# Patient Record
Sex: Male | Born: 1945 | Race: White | Hispanic: No | Marital: Married | Smoking: Never smoker
Health system: Southern US, Community
[De-identification: ages and names within clinical notes are randomized; demographics above are authoritative.]

## PROBLEM LIST (undated history)

## (undated) DIAGNOSIS — L719 Rosacea, unspecified: Secondary | ICD-10-CM

## (undated) DIAGNOSIS — Z8719 Personal history of other diseases of the digestive system: Secondary | ICD-10-CM

## (undated) DIAGNOSIS — M1990 Unspecified osteoarthritis, unspecified site: Secondary | ICD-10-CM

## (undated) DIAGNOSIS — K5000 Crohn's disease of small intestine without complications: Secondary | ICD-10-CM

## (undated) DIAGNOSIS — M8580 Other specified disorders of bone density and structure, unspecified site: Secondary | ICD-10-CM

## (undated) DIAGNOSIS — Z8711 Personal history of peptic ulcer disease: Secondary | ICD-10-CM

## (undated) DIAGNOSIS — D649 Anemia, unspecified: Secondary | ICD-10-CM

## (undated) DIAGNOSIS — K222 Esophageal obstruction: Secondary | ICD-10-CM

## (undated) DIAGNOSIS — K219 Gastro-esophageal reflux disease without esophagitis: Secondary | ICD-10-CM

## (undated) HISTORY — PX: OTHER SURGICAL HISTORY: SHX169

## (undated) HISTORY — PX: APPENDECTOMY: SHX54

## (undated) HISTORY — PX: TONSILLECTOMY: SUR1361

## (undated) HISTORY — PX: COLON SURGERY: SHX602

---

## 1991-05-05 HISTORY — PX: SMALL INTESTINE SURGERY: SHX150

## 1999-05-21 ENCOUNTER — Ambulatory Visit (HOSPITAL_COMMUNITY): Admission: RE | Admit: 1999-05-21 | Discharge: 1999-05-21 | Payer: Self-pay | Admitting: *Deleted

## 1999-12-14 ENCOUNTER — Encounter: Payer: Self-pay | Admitting: *Deleted

## 1999-12-14 ENCOUNTER — Emergency Department (HOSPITAL_COMMUNITY): Admission: EM | Admit: 1999-12-14 | Discharge: 1999-12-14 | Payer: Self-pay | Admitting: Emergency Medicine

## 2001-06-09 ENCOUNTER — Ambulatory Visit (HOSPITAL_COMMUNITY): Admission: RE | Admit: 2001-06-09 | Discharge: 2001-06-09 | Payer: Self-pay | Admitting: *Deleted

## 2003-07-18 ENCOUNTER — Ambulatory Visit (HOSPITAL_COMMUNITY): Admission: RE | Admit: 2003-07-18 | Discharge: 2003-07-18 | Payer: Self-pay | Admitting: *Deleted

## 2005-11-13 ENCOUNTER — Ambulatory Visit (HOSPITAL_COMMUNITY): Admission: RE | Admit: 2005-11-13 | Discharge: 2005-11-13 | Payer: Self-pay | Admitting: *Deleted

## 2005-11-24 ENCOUNTER — Ambulatory Visit: Payer: Self-pay | Admitting: Family Medicine

## 2010-11-26 ENCOUNTER — Other Ambulatory Visit: Payer: Self-pay | Admitting: Gastroenterology

## 2010-12-12 ENCOUNTER — Ambulatory Visit
Admission: RE | Admit: 2010-12-12 | Discharge: 2010-12-12 | Disposition: A | Discharge status: Home / Self Care | Payer: BC Managed Care – PPO | Source: Ambulatory Visit | Attending: Gastroenterology | Admitting: Gastroenterology

## 2011-01-13 ENCOUNTER — Ambulatory Visit (HOSPITAL_COMMUNITY)
Admission: RE | Admit: 2011-01-13 | Discharge: 2011-01-13 | Disposition: A | Discharge status: Home / Self Care | Payer: BC Managed Care – PPO | Source: Ambulatory Visit | Attending: Gastroenterology | Admitting: Gastroenterology

## 2011-01-13 DIAGNOSIS — K222 Esophageal obstruction: Secondary | ICD-10-CM | POA: Insufficient documentation

## 2011-01-13 DIAGNOSIS — R1310 Dysphagia, unspecified: Secondary | ICD-10-CM | POA: Insufficient documentation

## 2011-01-13 DIAGNOSIS — K2210 Ulcer of esophagus without bleeding: Secondary | ICD-10-CM | POA: Insufficient documentation

## 2011-08-06 ENCOUNTER — Encounter (HOSPITAL_COMMUNITY): Payer: Self-pay | Admitting: *Deleted

## 2011-08-11 ENCOUNTER — Encounter (HOSPITAL_COMMUNITY): Payer: Self-pay | Admitting: *Deleted

## 2011-08-11 ENCOUNTER — Ambulatory Visit (HOSPITAL_COMMUNITY)
Admission: RE | Admit: 2011-08-11 | Discharge: 2011-08-11 | Disposition: A | Discharge status: Home / Self Care | Payer: BC Managed Care – PPO | Source: Ambulatory Visit | Attending: Gastroenterology | Admitting: Gastroenterology

## 2011-08-11 ENCOUNTER — Encounter (HOSPITAL_COMMUNITY)
Admission: RE | Disposition: A | Discharge status: Home / Self Care | Payer: Self-pay | Source: Ambulatory Visit | Attending: Gastroenterology

## 2011-08-11 DIAGNOSIS — K208 Other esophagitis without bleeding: Secondary | ICD-10-CM | POA: Insufficient documentation

## 2011-08-11 DIAGNOSIS — K5000 Crohn's disease of small intestine without complications: Secondary | ICD-10-CM | POA: Insufficient documentation

## 2011-08-11 DIAGNOSIS — K222 Esophageal obstruction: Secondary | ICD-10-CM | POA: Insufficient documentation

## 2011-08-11 DIAGNOSIS — R1310 Dysphagia, unspecified: Secondary | ICD-10-CM | POA: Insufficient documentation

## 2011-08-11 HISTORY — DX: Rosacea, unspecified: L71.9

## 2011-08-11 HISTORY — PX: BALLOON DILATION: SHX5330

## 2011-08-11 HISTORY — DX: Anemia, unspecified: D64.9

## 2011-08-11 HISTORY — PX: COLONOSCOPY: SHX5424

## 2011-08-11 HISTORY — PX: ESOPHAGOGASTRODUODENOSCOPY: SHX5428

## 2011-08-11 HISTORY — DX: Esophageal obstruction: K22.2

## 2011-08-11 HISTORY — DX: Personal history of other diseases of the digestive system: Z87.19

## 2011-08-11 HISTORY — DX: Crohn's disease of small intestine without complications: K50.00

## 2011-08-11 HISTORY — DX: Personal history of peptic ulcer disease: Z87.11

## 2011-08-11 HISTORY — DX: Other specified disorders of bone density and structure, unspecified site: M85.80

## 2011-08-11 SURGERY — EGD (ESOPHAGOGASTRODUODENOSCOPY)
Anesthesia: Moderate Sedation

## 2011-08-11 MED ORDER — FENTANYL CITRATE 0.05 MG/ML IJ SOLN
INTRAMUSCULAR | Status: DC | PRN
  Administered 2011-08-11 (×4): 25 ug via INTRAVENOUS

## 2011-08-11 MED ORDER — FENTANYL CITRATE 0.05 MG/ML IJ SOLN
INTRAMUSCULAR | Status: AC
  Filled 2011-08-11: qty 4

## 2011-08-11 MED ORDER — MIDAZOLAM HCL 10 MG/2ML IJ SOLN
INTRAMUSCULAR | Status: AC
  Filled 2011-08-11: qty 4

## 2011-08-11 MED ORDER — SODIUM CHLORIDE 0.9 % IV SOLN
Freq: Once | INTRAVENOUS | Status: AC
  Administered 2011-08-11: 500 mL via INTRAVENOUS

## 2011-08-11 MED ORDER — MIDAZOLAM HCL 10 MG/2ML IJ SOLN
INTRAMUSCULAR | Status: DC | PRN
  Administered 2011-08-11 (×2): 2 mg via INTRAVENOUS
  Administered 2011-08-11 (×2): 1 mg via INTRAVENOUS
  Administered 2011-08-11 (×2): 2 mg via INTRAVENOUS

## 2011-08-11 NOTE — Op Note (Signed)
[facility] [address] [place], [place]  [address]  ENDOSCOPY PROCEDURE REPORT  PATIENT:  Jeffrey Mejia, Jeffrey Mejia  MR#:  [id number] BIRTHDATE:  10/13/1945, 65 yrs. old  GENDER:  male  ENDOSCOPIST:  Bernette Redbird, MD Referred by:  Catha Gosselin, M.D.  PROCEDURE DATE:  08/11/2011 PROCEDURE:  EGD with balloon dilatation ASA CLASS: INDICATIONS:  Recurrent, progressive dysphagia symptoms, approximately 6 months status post balloon dilatation of the distal esophagus to 15 mm  MEDICATIONS:   Fentanyl 50 mcg IV, Versed 5 mg IV TOPICAL ANESTHETIC:  none  DESCRIPTION OF PROCEDURE:   After the risks and benefits of the procedure were explained, informed consent was obtained.  The Pentax Gastroscope [id number] endoscope was introduced through the mouth and advanced to the second portion of the duodenum.  The instrument was slowly withdrawn as the mucosa was fully examined. <<PROCEDUREIMAGES>>  The larynx was normal. The esophagus was entered without significant difficulty. There was a focal erosion in the distal esophagus just above the squamocolumnar junction, similar to that observed 6 months ago, without deep ulceration, and without any evidence of mass effect or malignancy. No diffuse reflux esophagitis, infection, or varices were noted. There was concentric ringlike narrowing in the distal esophagus, offering minimal resistance to passage of the 10 mm endoscope, to a lesser degree than was the case 6 months ago.  The stomach contained no significant residual and had entirely normal mucosa, including a retroflex view of the cardia.  The pylorus, duodenal bulb, and second duodenum looked normal.  Balloon dilatation of the distal esophagus was then performed, to 15 mm and 16.5 mm, each time holding inflation for about 1 minute. After the second dilatation, there was a little bit of oozing of blood from one or 2 foci in the distal esophagus, including the erosive area in 1 or 2  areas of nearby mucosal disruption, including one area of the esophageal ring. There was no evidence of perforation or deep laceration. The scope was then withdrawn from the patient and the procedure completed.  COMPLICATIONS:  None  ENDOSCOPIC IMPRESSION:  1. Distal esophageal ring and mild deformity of the esophagus, dilated to 16.5 mm by through-the-scope balloon technique as described above 2. Focal distal esophagitis  RECOMMENDATIONS: continue PPI therapy. Clinical followup of dysphagia symptoms with consideration for repeat dilatation in another 6-12 months  ______________________________ Bernette Redbird, MD  CC:  n. eSIGNED:   Joice Nazario at 08/11/2011 10:29 AM  Maylon Cos, [id number]

## 2011-08-11 NOTE — Op Note (Signed)
[facility] [address] [place], [place]  [address]  COLONOSCOPY PROCEDURE REPORT  PATIENT:  Jeffrey, Mejia  MR#:  [id number] BIRTHDATE:  09/10/1945, 65 yrs. old  GENDER:  male ENDOSCOPIST:  Bernette Redbird, MD REF. BY:  Catha Gosselin, M.D. 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  No rectal exam performed. The Pentax Colonoscope [id number] pediatric colonoscope was introduced through the anus and advanced to the terminal ileum which was intubated for a short distance.  The quality of the prep was excellent, using Trilyte.  The instrument was then slowly withdrawn as the colon was fully examined. <<PROCEDUREIMAGES>>  FINDINGS: The patient had actively draining perianal fistulae, and nodular stenosis of the anal canal, such that I could only puts the tip of my index finger into the anal canal. As a result, I was not able to feel the patient's prostate gland. The pediatric colonoscope, however, was able to be introduced without significant resistance or difficulty through the somewhat stenotic and deformed anal canal, and from there, advanced with moderate difficulty, due to looping, to the terminal ileum. To advance the scope, and we needed external abdominal compression with the patient in the supine position and right lateral decubitus position to enter the base of the cecum.  The ileal cecal valve was free of inflammation or stenosis, and the terminal ileum was readily entered and had entirely normal mucosa, without any evidence of disease activity whatsoever. I was able to  advance the scope several centimeters of the terminal ileum to the area where it appeared there was a surgical anastomosis with the more proximal portion of the small bowel. The scope was then gradually withdrawn.  The cecum showed postoperative changes with some degree of surgical deformity and what appeared to be a stapled incision line.  The colon itself was normal. There was no evidence of polyps, masses, diverticular disease, or colitis.  In the distal rectum there was a smooth sessile 4 mm nodule with overlying exudate consistent with an inflammatory nodule. I elected not to biopsy it. It did not look like a polypectomy.  Retroflexion in the rectum was surprisingly unremarkable.  Pullout through the anal canal, however, showed significant erythema, friability, and nodularity.  No biopsies were obtained. The patient tolerated the procedure quite well.  COMPLICATIONS:  None ENDOSCOPIC IMPRESSION:  1. Perianal disease, characterized by draining fistulae, anal stenosis, nodularity and inflammation within the anal canal 2. Normal colon 3. Normal terminal ileum, without evidence of disease activity  RECOMMENDATIONS: Continue current medication, consider repeat exam in about 5 years to monitor for disease recurrence  REPEAT EXAM:  No  ______________________________ Bernette Redbird, MD  CC:  n. eSIGNEDMolly Maduro Flora Ratz at 08/11/2011 11:34 AM  Maylon Cos, [phone]

## 2011-08-11 NOTE — H&P (Signed)
  This 66 year old gentleman presents for endoscopic and colonoscopic evaluation, because of dysphagia symptoms and Crohn's disease, respectively.  With respect to his dysphagia, the patient has a prior history of a Schatzki's ring for which he underwent balloon dilatation for the first time ever to 15 mm in September of 2012, with marked improvement in his dysphagia symptoms but with a gradual and progressive recurrence of symptoms since that time.  With respect to his Crohn's disease, it is ileitis that was just diagnosed at age 88, now 20 years status post resection of about 5 feet of small bowel with sparing of the ileal cecal valve, as his only resection ever. 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 He has been on Remicade for the past 12 years, every 4 weeks, as maintenance therapy.   Past medical history:    --no known allergies   outpatient medications: Prilosec Align probiotic therapy, fexofenadine, Cipro 500 twice a day, vitamin B12 monthly injection, vitamin D, alendronate, Remicade  Operations: Extensive small bowel resection 1993  Chronic medical illnesses: Crohn's ileitis as per history of present illness. History of melanoma, migraine headaches, elevated PSA, esophageal mucosal ring with focal esophagitis (? Pill-induced) September 2012  Physical exam: This is a very pleasant, articulate, thin but otherwise healthy-appearing Caucasian male who does not appear in any distress, anxious, or depressed. Anicteric, no pallor. Chest clear, no rales or wheezes. Heart normal, no murmurs or arrhythmias. Abdomen without evidence ventral hernia, no organomegaly, guarding, mass effect, or tenderness. Neurologic grossly normal, with intact cognition and no obvious focal motor deficits.  Impression: 1. Dysphagia due to recurrence of esophageal mucosal ring 2. Crohn's ileitis, now about 5 years  status post his most recent colonoscopy, clinically in remission on Remicade maintenance therapy, except for chronic perianal draining fistulae  Plan:  1. Upper endoscopy with through-the-scope balloon dilatation of distal esophageal ring 2. Colonoscopy to assess current status of ileal mucosa  Florencia Reasons, M.D. [phone]

## 2011-08-12 ENCOUNTER — Encounter (HOSPITAL_COMMUNITY): Payer: Self-pay

## 2011-08-12 ENCOUNTER — Encounter (HOSPITAL_COMMUNITY): Payer: Self-pay | Admitting: Gastroenterology

## 2013-04-11 IMAGING — RF DG ESOPHAGUS
19 of 24 series · 19 of 24 positions shown · non-contrast
Comparison: None.

CLINICAL DATA: Dysphagia.

ESOPHOGRAM/BARIUM SWALLOW
TECHNIQUE: Combined double contrast and single contrast
examination performed using effervescent crystals, thick barium
liquid, and thin barium liquid. 13mm barium tablet ingested with
water.
Fluoroscopy time:  2.8 minutes.

[Series 1: run · 1 of 3 slices shown (1 of 19)]
[im 1/3]
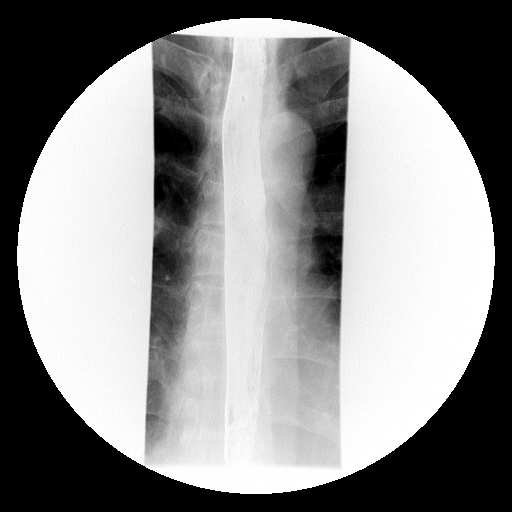

[Series 2: run · 1 of 1 slices shown (2 of 19)]
[im 1/1]
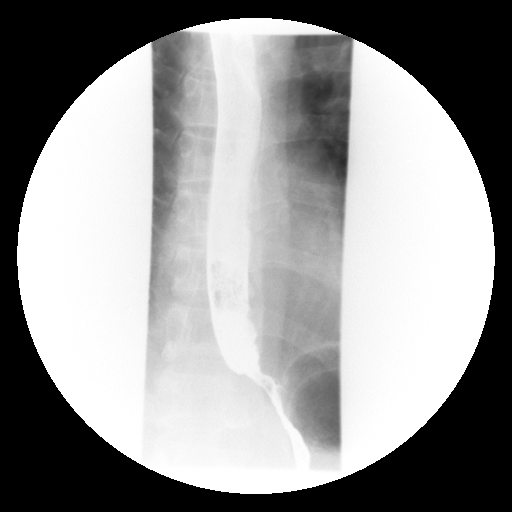

[Series 4: run · 1 of 1 slices shown (3 of 19)]
[im 1/1]
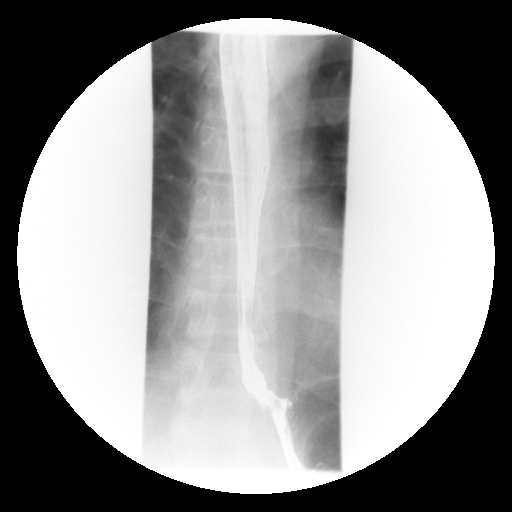

[Series 5: run · 1 of 1 slices shown (4 of 19)]
[im 1/1]
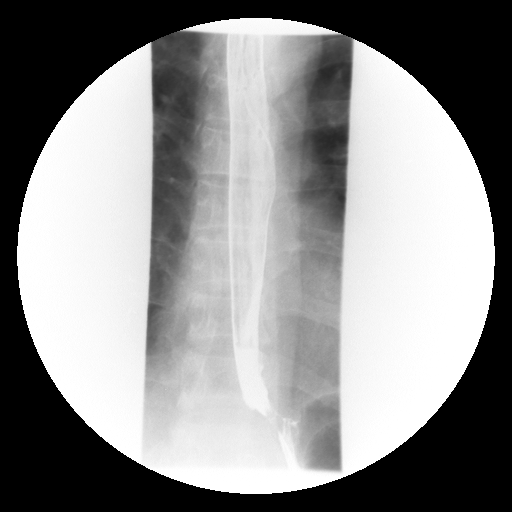

[Series 6: run · 1 of 1 slices shown (5 of 19)]
[im 1/1]
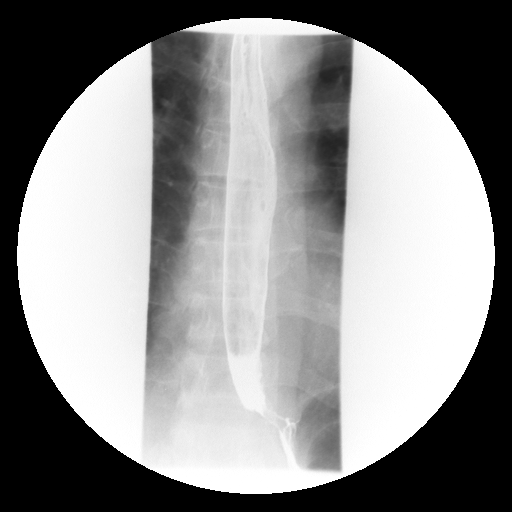

[Series 7: run · 1 of 1 slices shown (6 of 19)]
[im 1/1]
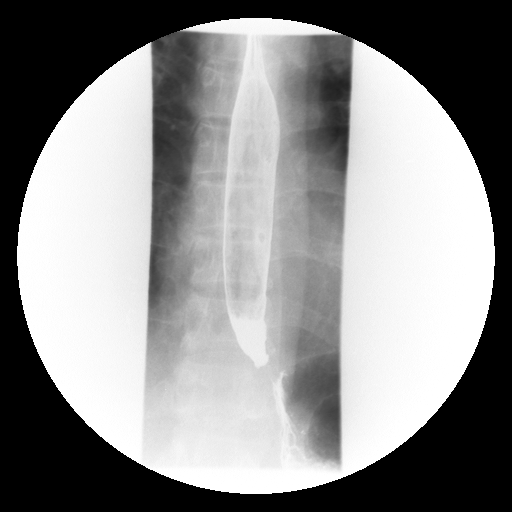

[Series 9: run · 1 of 1 slices shown (7 of 19)]
[im 1/1]
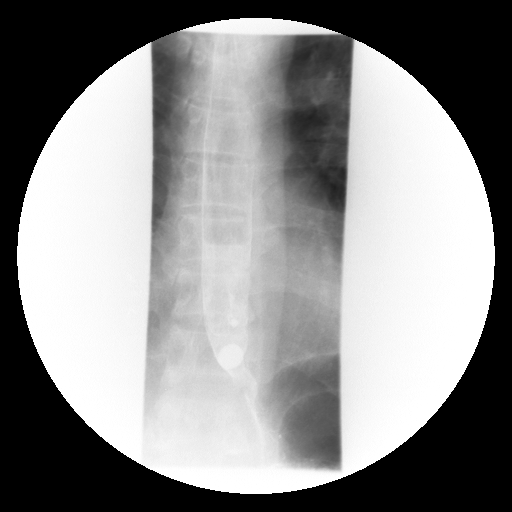

[Series 10: run · 1 of 1 slices shown (8 of 19)]
[im 1/1]
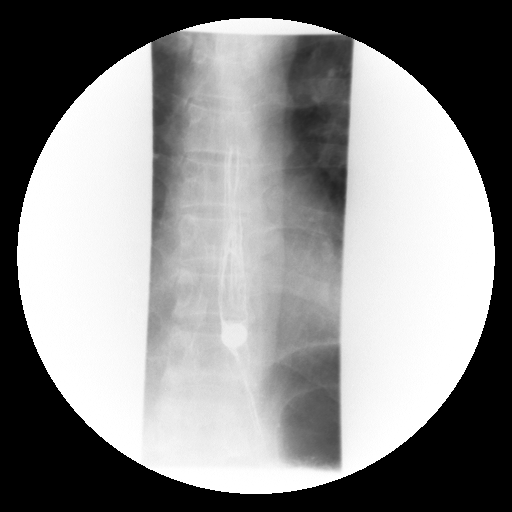

[Series 11: run · 1 of 1 slices shown (9 of 19)]
[im 1/1]
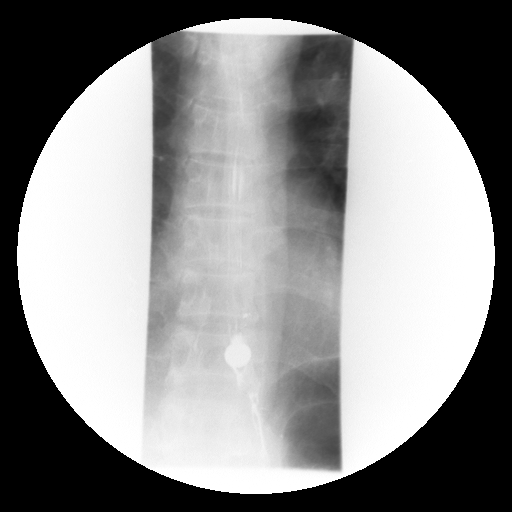

[Series 13: run · 1 of 1 slices shown (10 of 19)]
[im 1/1]
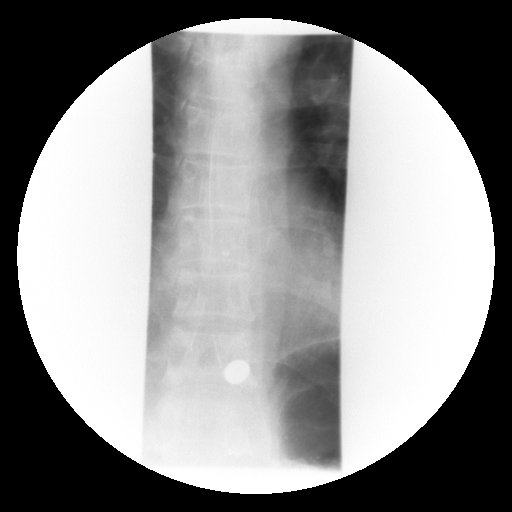

[Series 14: run · 1 of 4 slices shown (11 of 19)]
[im 1/4]
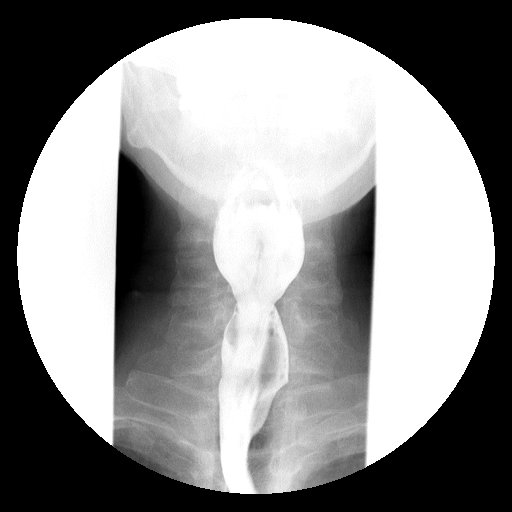

[Series 15: run · 1 of 1 slices shown (12 of 19)]
[im 1/1]
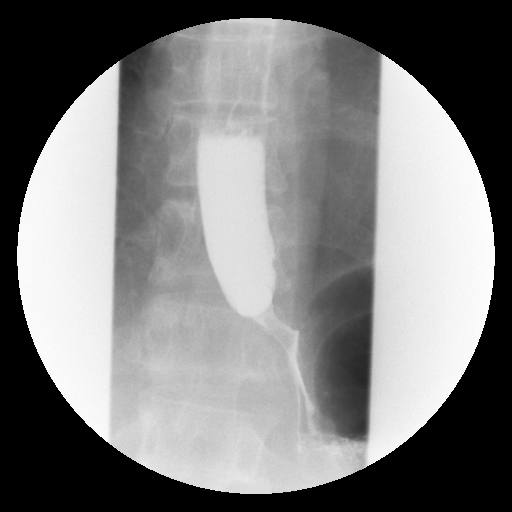

[Series 16: run · 1 of 1 slices shown (13 of 19)]
[im 1/1]
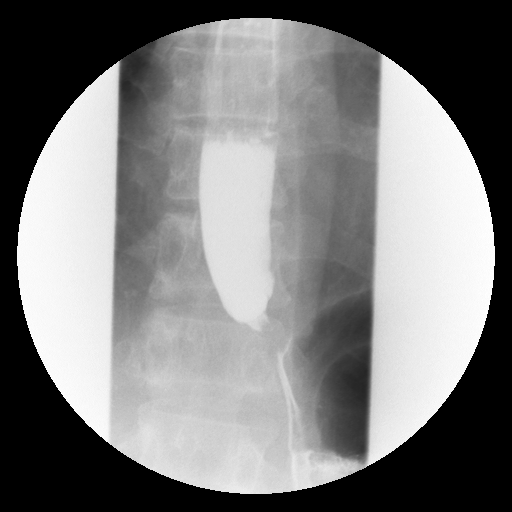

[Series 18: run · 1 of 1 slices shown (14 of 19)]
[im 1/1]
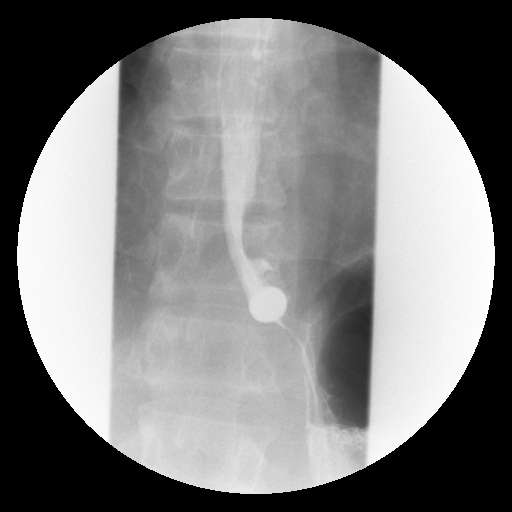

[Series 19: run · 1 of 4 slices shown (15 of 19)]
[im 1/4]
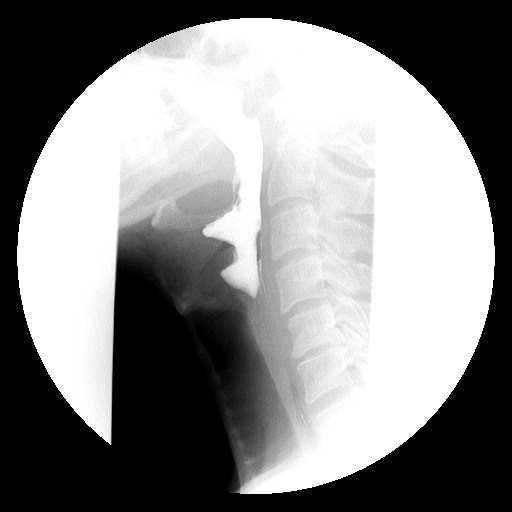

[Series 20: run · 1 of 1 slices shown (16 of 19)]
[im 1/1]
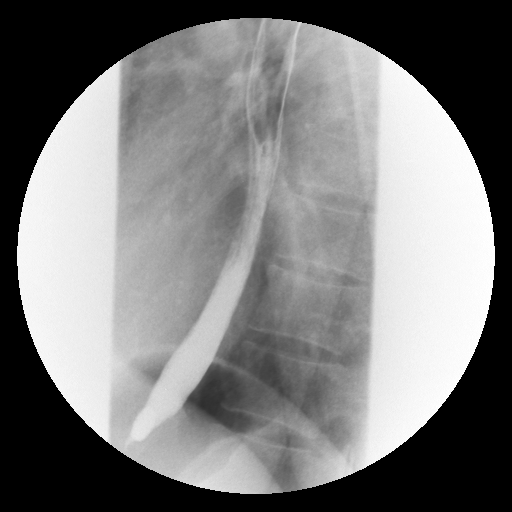

[Series 21: run · 1 of 1 slices shown (17 of 19)]
[im 1/1]
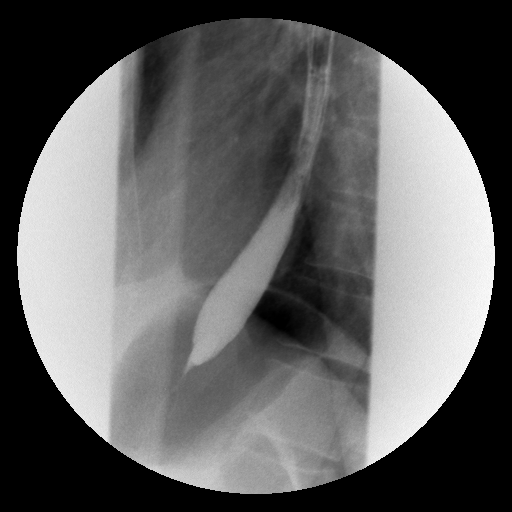

[Series 23: run · 1 of 1 slices shown (18 of 19)]
[im 1/1]
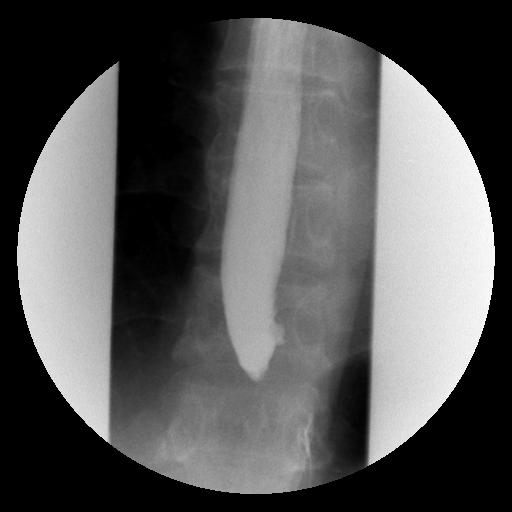

[Series 24: run · 1 of 1 slices shown (19 of 19)]
[im 1/1]
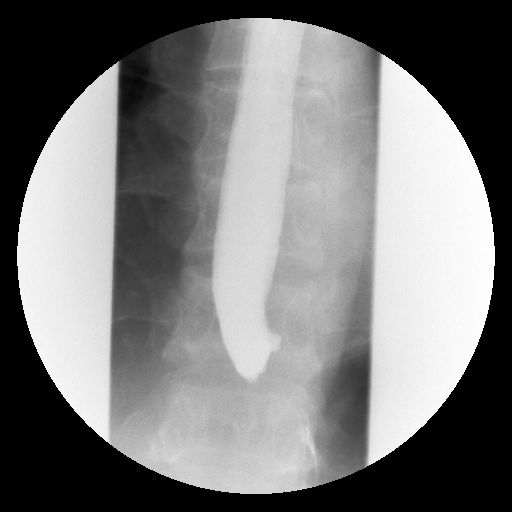

[19 of 24 positions shown; findings below may reference images not displayed]

FINDINGS: Normal antegrade peristalsis through cervical and
thoracic esophagus visualized.

Ingested 13 mm barium tablet demonstrated complete obstruction
proximal to smooth short segment narrowing distal esophagus just
superior to tiny sliding type hiatus hernia consistent with
combination of esophageal Schatzki ring and short segment smooth
distal esophageal stricture.

At left anterior distal esophagus is probable partially filled
epiphrenic pulsion diverticulum.  (Slight mucosal inflammatory
irregularity possible).

No spontaneous or induced (Valsalva/water siphon) gastroesophageal
reflux demonstrated.  No other intrinsic or extrinsic esophageal
lesions seen.
IMPRESSION: 1.  Complete obstruction to ingested 13 mm barium tablet proximal
to probable combination of thickened Schatzki esophageal ring and
short segment smooth distal esophageal stricture at superior aspect
tiny sliding type hiatus hernia.
2.  Probable subcentimeter epiphrenic antero left lateral pulsion
diverticulum (slight inflammatory mucosal irregularity possible).
3.  Otherwise, negative.

## 2015-01-01 ENCOUNTER — Telehealth: Payer: Self-pay | Admitting: Lab

## 2015-01-01 ENCOUNTER — Other Ambulatory Visit: Payer: Self-pay

## 2015-01-01 NOTE — Telephone Encounter (Signed)
Patient scheduled to have lab work drawn on 01/01/15@ 1115-At that time he will be given an appmt to see Dr Luciana Axe

## 2015-01-02 ENCOUNTER — Telehealth: Payer: Self-pay | Admitting: Lab

## 2015-01-02 ENCOUNTER — Other Ambulatory Visit: Payer: Medicare Other

## 2015-01-02 DIAGNOSIS — B182 Chronic viral hepatitis C: Secondary | ICD-10-CM

## 2015-01-02 LAB — COMPREHENSIVE METABOLIC PANEL
ALT: 10 U/L (ref 9–46)
AST: 26 U/L (ref 10–35)
Albumin: 4.1 g/dL (ref 3.6–5.1)
Alkaline Phosphatase: 78 U/L (ref 40–115)
BILIRUBIN TOTAL: 0.6 mg/dL (ref 0.2–1.2)
BUN: 17 mg/dL (ref 7–25)
CHLORIDE: 102 mmol/L (ref 98–110)
CO2: 27 mmol/L (ref 20–31)
CREATININE: 1.4 mg/dL — AB (ref 0.70–1.25)
Calcium: 9.4 mg/dL (ref 8.6–10.3)
Glucose, Bld: 95 mg/dL (ref 65–99)
Potassium: 4.7 mmol/L (ref 3.5–5.3)
SODIUM: 138 mmol/L (ref 135–146)
Total Protein: 7.3 g/dL (ref 6.1–8.1)

## 2015-01-02 LAB — IRON: Iron: 87 ug/dL (ref 50–180)

## 2015-01-02 NOTE — Telephone Encounter (Signed)
Patient had blood work done on 01/02/15 and appt with Dr Luciana Axe is on 01/16/15 .  Pateint had some concerns about crohn's disease infusion that he receives and the Hep C diagnosis.  Per Angelique Blonder -patient was instructed to have Dr's office that give infusions call Dr Luciana Axe directly with concerns.  Patient's next infusion is 01/15/15 before he sees Dr Luciana Axe for the first time on 01/16/15

## 2015-01-03 LAB — HEPATITIS C RNA QUANTITATIVE: HCV QUANT: NOT DETECTED [IU]/mL (ref <15)

## 2015-01-03 LAB — ANA: ANA: NEGATIVE

## 2015-01-03 LAB — PROTIME-INR
INR: 1.02 (ref <1.50)
Prothrombin Time: 13.5 seconds (ref 11.6–15.2)

## 2015-01-03 LAB — HIV ANTIBODY (ROUTINE TESTING W REFLEX): HIV 1&2 Ab, 4th Generation: NONREACTIVE

## 2015-01-03 LAB — HEPATITIS B CORE ANTIBODY, TOTAL: HEP B C TOTAL AB: NONREACTIVE

## 2015-01-03 LAB — HEPATITIS A ANTIBODY, TOTAL: HEP A TOTAL AB: BORDERLINE — AB

## 2015-01-08 LAB — HEPATITIS C GENOTYPE

## 2015-01-08 NOTE — Telephone Encounter (Signed)
Pt negative for Hep C per Dr. Gwynneth Macleod cancelled appmt for 01/16/15-Lm for pt to call me

## 2015-01-16 ENCOUNTER — Encounter: Payer: Self-pay | Admitting: Internal Medicine

## 2015-03-18 ENCOUNTER — Other Ambulatory Visit: Payer: Self-pay | Admitting: Family Medicine

## 2015-03-18 ENCOUNTER — Ambulatory Visit
Admission: RE | Admit: 2015-03-18 | Discharge: 2015-03-18 | Disposition: A | Discharge status: Home / Self Care | Payer: Medicare Other | Source: Ambulatory Visit | Attending: Family Medicine | Admitting: Family Medicine

## 2015-03-18 DIAGNOSIS — M25561 Pain in right knee: Secondary | ICD-10-CM

## 2015-03-18 DIAGNOSIS — W19XXXA Unspecified fall, initial encounter: Secondary | ICD-10-CM

## 2015-05-20 ENCOUNTER — Other Ambulatory Visit: Payer: Self-pay | Admitting: Physician Assistant

## 2015-05-20 NOTE — H&P (Signed)
12 seven year-old active male.  Followed medically by Dr. Catha Gosselin.  November vertical load twisting injury falling off a ladder, right knee.  Pain, soreness and swelling medially.  Swelling and ecchymosis resolved.  Persistent mechanical symptoms medially.  Locking with squatting, twisting and bending.  He has given this time, rest and alteration of activity and anti-inflammatories without improvement.  He comes in for evaluation and treatment recommendation.  No previous workup.  No previous significant knee problems or operative intervention.  No injections.  Otherwise good health.   History and general exam is reviewed.   EXAMINATION: Specifically, healthy appearing 70 year-old male.  Height: 5?10.  Weight: 145 pounds.  Lungs clear to auscultation bilaterally. Heart sounds normal.  A little bit of an antalgic gait on the right.  Heart: Regular in rate and rhythm.  Lungs: Clear. Specifically, right knee, point tender medial joint line.  Positive McMurray's.  Ligaments are stable.  No pain with stress of his MCL.  ACL feels intact.  Only trace effusion.  No grating.  No crepitus.  No joint line tenderness on the left.  Neurovascularly intact distally.    X-RAYS: Four view standing x-ray really shows no significant degenerative change.  No acute bony abnormalities.    DISPOSITION:  Medial meniscus tear, right knee.  Symptoms now more than two months.  Sufficient signs and symptoms to warrant workup and treatment.  Discussed this with him.  MRI to delineate pathology.  Call when that is complete.  We have already talked about definitive treatment.  Arthroscopy, partial meniscectomy.  Procedure, risks, benefits and complications reviewed.  More than 25 minutes spent face-to-face covering all of this with him.  Final decision after we see his scan, but I think in all likelihood we are going to be going to arthroscopy and he understands.    Loreta Ave, M.D.    Addendum: MRI right knee  reveals medial meniscus tear.  We will proceed with above mentioned surgical intervention.

## 2015-05-24 ENCOUNTER — Encounter (HOSPITAL_BASED_OUTPATIENT_CLINIC_OR_DEPARTMENT_OTHER): Payer: Self-pay | Admitting: *Deleted

## 2015-05-30 ENCOUNTER — Ambulatory Visit (HOSPITAL_BASED_OUTPATIENT_CLINIC_OR_DEPARTMENT_OTHER): Payer: Medicare Other | Admitting: Anesthesiology

## 2015-05-30 ENCOUNTER — Encounter (HOSPITAL_BASED_OUTPATIENT_CLINIC_OR_DEPARTMENT_OTHER): Payer: Self-pay

## 2015-05-30 ENCOUNTER — Ambulatory Visit (HOSPITAL_BASED_OUTPATIENT_CLINIC_OR_DEPARTMENT_OTHER)
Admission: RE | Admit: 2015-05-30 | Discharge: 2015-05-30 | Disposition: A | Discharge status: Home / Self Care | Payer: Medicare Other | Source: Ambulatory Visit | Attending: Orthopedic Surgery | Admitting: Orthopedic Surgery

## 2015-05-30 ENCOUNTER — Encounter (HOSPITAL_BASED_OUTPATIENT_CLINIC_OR_DEPARTMENT_OTHER)
Admission: RE | Disposition: A | Discharge status: Home / Self Care | Payer: Self-pay | Source: Ambulatory Visit | Attending: Orthopedic Surgery

## 2015-05-30 DIAGNOSIS — K219 Gastro-esophageal reflux disease without esophagitis: Secondary | ICD-10-CM | POA: Insufficient documentation

## 2015-05-30 DIAGNOSIS — M2241 Chondromalacia patellae, right knee: Secondary | ICD-10-CM | POA: Diagnosis not present

## 2015-05-30 DIAGNOSIS — S83241A Other tear of medial meniscus, current injury, right knee, initial encounter: Secondary | ICD-10-CM | POA: Diagnosis not present

## 2015-05-30 DIAGNOSIS — K7460 Unspecified cirrhosis of liver: Secondary | ICD-10-CM | POA: Diagnosis not present

## 2015-05-30 DIAGNOSIS — M1990 Unspecified osteoarthritis, unspecified site: Secondary | ICD-10-CM | POA: Diagnosis not present

## 2015-05-30 DIAGNOSIS — Z8719 Personal history of other diseases of the digestive system: Secondary | ICD-10-CM | POA: Insufficient documentation

## 2015-05-30 HISTORY — DX: Unspecified osteoarthritis, unspecified site: M19.90

## 2015-05-30 HISTORY — DX: Gastro-esophageal reflux disease without esophagitis: K21.9

## 2015-05-30 HISTORY — PX: KNEE ARTHROSCOPY WITH MEDIAL MENISECTOMY: SHX5651

## 2015-05-30 SURGERY — ARTHROSCOPY, KNEE, WITH MEDIAL MENISCECTOMY
Anesthesia: General | Site: Knee | Laterality: Right

## 2015-05-30 MED ORDER — PROPOFOL 10 MG/ML IV BOLUS
INTRAVENOUS | Status: AC
  Filled 2015-05-30: qty 20

## 2015-05-30 MED ORDER — FENTANYL CITRATE (PF) 100 MCG/2ML IJ SOLN: 25.0000 ug | INTRAMUSCULAR | Status: DC | PRN

## 2015-05-30 MED ORDER — ONDANSETRON HCL 4 MG PO TABS: 4.0000 mg | ORAL_TABLET | Freq: Three times a day (TID) | ORAL | Status: DC | PRN

## 2015-05-30 MED ORDER — FENTANYL CITRATE (PF) 100 MCG/2ML IJ SOLN
INTRAMUSCULAR | Status: AC
  Filled 2015-05-30: qty 2

## 2015-05-30 MED ORDER — OXYCODONE HCL 5 MG/5ML PO SOLN: 5.0000 mg | Freq: Once | ORAL | Status: DC | PRN

## 2015-05-30 MED ORDER — LIDOCAINE HCL (CARDIAC) 20 MG/ML IV SOLN
INTRAVENOUS | Status: DC | PRN
  Administered 2015-05-30: 80 mg via INTRAVENOUS

## 2015-05-30 MED ORDER — MIDAZOLAM HCL 2 MG/2ML IJ SOLN
1.0000 mg | INTRAMUSCULAR | Status: DC | PRN
  Administered 2015-05-30: 2 mg via INTRAVENOUS

## 2015-05-30 MED ORDER — OXYCODONE HCL 5 MG PO TABS: 5.0000 mg | ORAL_TABLET | Freq: Once | ORAL | Status: DC | PRN

## 2015-05-30 MED ORDER — SCOPOLAMINE 1 MG/3DAYS TD PT72
1.0000 | MEDICATED_PATCH | Freq: Once | TRANSDERMAL | Status: DC
Start: 2015-05-30 — End: 2015-05-30

## 2015-05-30 MED ORDER — CHLORHEXIDINE GLUCONATE 4 % EX LIQD: 60.0000 mL | Freq: Once | CUTANEOUS | Status: DC

## 2015-05-30 MED ORDER — ONDANSETRON HCL 4 MG/2ML IJ SOLN
INTRAMUSCULAR | Status: AC
  Filled 2015-05-30: qty 2

## 2015-05-30 MED ORDER — METHYLPREDNISOLONE ACETATE 80 MG/ML IJ SUSP
INTRAMUSCULAR | Status: AC
  Filled 2015-05-30: qty 1

## 2015-05-30 MED ORDER — FENTANYL CITRATE (PF) 100 MCG/2ML IJ SOLN
50.0000 ug | INTRAMUSCULAR | Status: DC | PRN
  Administered 2015-05-30: 100 ug via INTRAVENOUS
  Administered 2015-05-30: 25 ug via INTRAVENOUS

## 2015-05-30 MED ORDER — GLYCOPYRROLATE 0.2 MG/ML IJ SOLN: 0.2000 mg | Freq: Once | INTRAMUSCULAR | Status: DC | PRN

## 2015-05-30 MED ORDER — CEFAZOLIN SODIUM-DEXTROSE 2-3 GM-% IV SOLR
2.0000 g | INTRAVENOUS | Status: AC
  Administered 2015-05-30: 2 g via INTRAVENOUS

## 2015-05-30 MED ORDER — BUPIVACAINE HCL (PF) 0.5 % IJ SOLN
INTRAMUSCULAR | Status: DC | PRN
  Administered 2015-05-30: 20 mL

## 2015-05-30 MED ORDER — SODIUM CHLORIDE 0.9 % IR SOLN
Status: DC | PRN
Start: 2015-05-30 — End: 2015-05-30
  Administered 2015-05-30: 1

## 2015-05-30 MED ORDER — ONDANSETRON HCL 4 MG/2ML IJ SOLN
INTRAMUSCULAR | Status: DC | PRN
  Administered 2015-05-30: 4 mg via INTRAVENOUS

## 2015-05-30 MED ORDER — LACTATED RINGERS IV SOLN
INTRAVENOUS | Status: DC
  Administered 2015-05-30: 12:00:00 via INTRAVENOUS

## 2015-05-30 MED ORDER — PROMETHAZINE HCL 25 MG/ML IJ SOLN: 6.2500 mg | INTRAMUSCULAR | Status: DC | PRN

## 2015-05-30 MED ORDER — PROPOFOL 10 MG/ML IV BOLUS
INTRAVENOUS | Status: DC | PRN
  Administered 2015-05-30: 150 mg via INTRAVENOUS

## 2015-05-30 MED ORDER — METHYLPREDNISOLONE ACETATE 80 MG/ML IJ SUSP
INTRAMUSCULAR | Status: DC | PRN
  Administered 2015-05-30: 80 mg via INTRA_ARTICULAR

## 2015-05-30 MED ORDER — MEPERIDINE HCL 25 MG/ML IJ SOLN: 6.2500 mg | INTRAMUSCULAR | Status: DC | PRN

## 2015-05-30 MED ORDER — DEXAMETHASONE SODIUM PHOSPHATE 4 MG/ML IJ SOLN
INTRAMUSCULAR | Status: DC | PRN
  Administered 2015-05-30: 10 mg via INTRAVENOUS

## 2015-05-30 MED ORDER — LIDOCAINE HCL (CARDIAC) 20 MG/ML IV SOLN
INTRAVENOUS | Status: AC
  Filled 2015-05-30: qty 5

## 2015-05-30 MED ORDER — OXYCODONE-ACETAMINOPHEN 5-325 MG PO TABS: 1.0000 | ORAL_TABLET | ORAL | Status: DC | PRN

## 2015-05-30 MED ORDER — DEXAMETHASONE SODIUM PHOSPHATE 10 MG/ML IJ SOLN
INTRAMUSCULAR | Status: AC
  Filled 2015-05-30: qty 1

## 2015-05-30 MED ORDER — MIDAZOLAM HCL 2 MG/2ML IJ SOLN
INTRAMUSCULAR | Status: AC
  Filled 2015-05-30: qty 2

## 2015-05-30 SURGICAL SUPPLY — 40 items
BANDAGE ACE 6X5 VEL STRL LF (GAUZE/BANDAGES/DRESSINGS) ×3 IMPLANT
BLADE CUDA 5.5 (BLADE) IMPLANT
BLADE CUDA GRT WHITE 3.5 (BLADE) IMPLANT
BLADE CUTTER GATOR 3.5 (BLADE) ×3 IMPLANT
BLADE CUTTER MENIS 5.5 (BLADE) IMPLANT
BLADE GREAT WHITE 4.2 (BLADE) ×2 IMPLANT
BLADE GREAT WHITE 4.2MM (BLADE) ×1
BUR OVAL 4.0 (BURR) IMPLANT
CUTTER MENISCUS  4.2MM (BLADE)
CUTTER MENISCUS 4.2MM (BLADE) IMPLANT
DRAPE ARTHROSCOPY W/POUCH 90 (DRAPES) ×3 IMPLANT
DURAPREP 26ML APPLICATOR (WOUND CARE) ×3 IMPLANT
ELECT MENISCUS 165MM 90D (ELECTRODE) IMPLANT
ELECT REM PT RETURN 9FT ADLT (ELECTROSURGICAL)
ELECTRODE REM PT RTRN 9FT ADLT (ELECTROSURGICAL) IMPLANT
GAUZE SPONGE 4X4 12PLY STRL (GAUZE/BANDAGES/DRESSINGS) ×3 IMPLANT
GAUZE XEROFORM 1X8 LF (GAUZE/BANDAGES/DRESSINGS) ×3 IMPLANT
GLOVE BIOGEL PI IND STRL 7.0 (GLOVE) ×2 IMPLANT
GLOVE BIOGEL PI INDICATOR 7.0 (GLOVE) ×4
GLOVE ECLIPSE 6.5 STRL STRAW (GLOVE) ×3 IMPLANT
GLOVE ECLIPSE 7.0 STRL STRAW (GLOVE) ×3 IMPLANT
GLOVE SURG ORTHO 8.0 STRL STRW (GLOVE) ×3 IMPLANT
GOWN STRL REUS W/ TWL LRG LVL3 (GOWN DISPOSABLE) ×2 IMPLANT
GOWN STRL REUS W/ TWL XL LVL3 (GOWN DISPOSABLE) ×1 IMPLANT
GOWN STRL REUS W/TWL LRG LVL3 (GOWN DISPOSABLE) ×4
GOWN STRL REUS W/TWL XL LVL3 (GOWN DISPOSABLE) ×2
HOLDER KNEE FOAM BLUE (MISCELLANEOUS) ×3 IMPLANT
IV NS IRRIG 3000ML ARTHROMATIC (IV SOLUTION) IMPLANT
IV SOD CHL 0.9% 1000ML (IV SOLUTION) ×12 IMPLANT
KNEE WRAP E Z 3 GEL PACK (MISCELLANEOUS) ×3 IMPLANT
MANIFOLD NEPTUNE II (INSTRUMENTS) ×3 IMPLANT
PACK ARTHROSCOPY DSU (CUSTOM PROCEDURE TRAY) ×3 IMPLANT
PACK BASIN DAY SURGERY FS (CUSTOM PROCEDURE TRAY) ×3 IMPLANT
PENCIL BUTTON HOLSTER BLD 10FT (ELECTRODE) IMPLANT
SET ARTHROSCOPY TUBING (MISCELLANEOUS) ×2
SET ARTHROSCOPY TUBING LN (MISCELLANEOUS) ×1 IMPLANT
SUT ETHILON 3 0 PS 1 (SUTURE) ×3 IMPLANT
SUT VIC AB 3-0 FS2 27 (SUTURE) IMPLANT
TOWEL OR 17X24 6PK STRL BLUE (TOWEL DISPOSABLE) ×3 IMPLANT
WATER STERILE IRR 1000ML POUR (IV SOLUTION) ×3 IMPLANT

## 2015-05-30 NOTE — Anesthesia Procedure Notes (Signed)
Procedure Name: LMA Insertion Date/Time: 05/30/2015 12:13 PM Performed by: Gar Gibbon Pre-anesthesia Checklist: Patient identified, Emergency Drugs available, Suction available and Patient being monitored Patient Re-evaluated:Patient Re-evaluated prior to inductionOxygen Delivery Method: Circle System Utilized Preoxygenation: Pre-oxygenation with 100% oxygen Intubation Type: IV induction Ventilation: Mask ventilation without difficulty LMA: LMA inserted LMA Size: 4.0 Number of attempts: 1 Airway Equipment and Method: Bite block Placement Confirmation: positive ETCO2 Tube secured with: Tape Dental Injury: Teeth and Oropharynx as per pre-operative assessment

## 2015-05-30 NOTE — Anesthesia Preprocedure Evaluation (Signed)
Anesthesia Evaluation  Patient identified by MRN, date of birth, ID band Patient awake    Reviewed: Allergy & Precautions, NPO status , Patient's Chart, lab work & pertinent test results  Airway Mallampati: II  TM Distance: >3 FB Neck ROM: Full    Dental no notable dental hx. 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   Abdominal   Peds  Hematology  (+) anemia ,   Anesthesia Other Findings   Reproductive/Obstetrics                             Anesthesia Physical Anesthesia Plan  ASA: II  Anesthesia Plan: General   Post-op Pain Management:    Induction: Intravenous  Airway Management Planned: LMA  Additional Equipment:   Intra-op Plan:   Post-operative Plan: Extubation in OR  Informed Consent: I have reviewed the patients History and Physical, chart, labs and discussed the procedure including the risks, benefits and alternatives for the proposed anesthesia with the patient or authorized representative who has indicated his/her understanding and acceptance.   Dental advisory given  Plan Discussed with: CRNA, Anesthesiologist and Surgeon  Anesthesia Plan Comments:         Anesthesia Quick Evaluation

## 2015-05-30 NOTE — Anesthesia Postprocedure Evaluation (Signed)
Anesthesia Post Note  Patient: Jeffrey Mejia  Procedure(s) Performed: Procedure(s) (LRB): RIGHT KNEE ARTHROSCOPY CHONDROPLASTY, MEDIAL MENISECTOMY (Right)  Patient location during evaluation: PACU Anesthesia Type: General Level of consciousness: awake and alert Pain management: pain level controlled Vital Signs Assessment: post-procedure vital signs reviewed and stable Respiratory status: spontaneous breathing, nonlabored ventilation and respiratory function stable Cardiovascular status: blood pressure returned to baseline and stable Postop Assessment: no signs of nausea or vomiting Anesthetic complications: no    Last Vitals:  Filed Vitals:   05/30/15 1330 05/30/15 1401  BP: 111/81 110/78  Pulse: 76 82  Temp:  36.5 C  Resp: 13 18    Last Pain:  Filed Vitals:   05/30/15 1416  PainSc: 2                  [name] A

## 2015-05-30 NOTE — Interval H&P Note (Signed)
History and Physical Interval Note:  05/30/2015 7:43 AM  Jeffrey Mejia  has presented today for surgery, with the diagnosis of CHONDROMALACIA PATELLAE RIGHT KNEE, OTHER TEAR OF MEDIAL MENISCUS CURRENT INJURY UNSPECIFIED KNEE, INITIAL ENCOUNTER  The various methods of treatment have been discussed with the patient and family. After consideration of risks, benefits and other options for treatment, the patient has consented to  Procedure(s): RIGHT KNEE ARTHROSCOPY CHONDROPLASTY, MEDIAL MENISECTOMY (Right) as a surgical intervention .  The patient's history has been reviewed, patient examined, no change in status, stable for surgery.  I have reviewed the patient's chart and labs.  Questions were answered to the patient's satisfaction.     Loreta Ave

## 2015-05-30 NOTE — Transfer of Care (Signed)
Immediate Anesthesia Transfer of Care Note  Patient: Jeffrey Mejia  Procedure(s) Performed: Procedure(s): RIGHT KNEE ARTHROSCOPY CHONDROPLASTY, MEDIAL MENISECTOMY (Right)  Patient Location: PACU  Anesthesia Type:General  Level of Consciousness: sedated and responds to stimulation  Airway & Oxygen Therapy: Patient Spontanous Breathing and Patient connected to face mask oxygen  Post-op Assessment: Report given to RN and Post -op Vital signs reviewed and stable  Post vital signs: Reviewed and stable  Last Vitals:  Filed Vitals:   05/30/15 1130 05/30/15 1304  BP: 86/70   Pulse: 77 63  Temp: 36.7 C   Resp: 18 12    Complications: No apparent anesthesia complications

## 2015-05-30 NOTE — Discharge Instructions (Signed)

## 2015-05-31 ENCOUNTER — Encounter (HOSPITAL_BASED_OUTPATIENT_CLINIC_OR_DEPARTMENT_OTHER): Payer: Self-pay | Admitting: Orthopedic Surgery

## 2015-05-31 NOTE — Anesthesia Postprocedure Evaluation (Signed)
Anesthesia Post Note  Patient: Jeffrey Mejia  Procedure(s) Performed: Procedure(s) (LRB): RIGHT KNEE ARTHROSCOPY CHONDROPLASTY, MEDIAL MENISECTOMY (Right)  Patient location during evaluation: PACU Anesthesia Type: General Level of consciousness: awake and alert and oriented Pain management: pain level controlled Vital Signs Assessment: post-procedure vital signs reviewed and stable Respiratory status: spontaneous breathing, nonlabored ventilation and respiratory function stable Cardiovascular status: blood pressure returned to baseline and stable Postop Assessment: no signs of nausea or vomiting Anesthetic complications: no    Last Vitals:  Filed Vitals:   05/30/15 1330 05/30/15 1401  BP: 111/81 110/78  Pulse: 76 82  Temp:  36.5 C  Resp: 13 18    Last Pain:  Filed Vitals:   05/30/15 1416  PainSc: 2                  Dhanvi Boesen A.

## 2015-05-31 NOTE — Op Note (Signed)
Jeffrey Mejia, Jeffrey Mejia               ACCOUNT NO.:  [id number]  MEDICAL RECORD NO.:  [id number]  LOCATION:                                 FACILITY:  PHYSICIAN:  Loreta Ave, M.D. DATE OF BIRTH:  [birth year]  DATE OF PROCEDURE:  05/30/2015 DATE OF DISCHARGE:                              OPERATIVE REPORT   PREOPERATIVE DIAGNOSIS:  Medial meniscus tear, right knee. Chondromalacia patella.  \  POSTOPERATIVE DIAGNOSIS:  Medial meniscus tear, right knee. Chondromalacia patella.  \  PROCEDURE:  Right knee exam under anesthesia and arthroscopy.  Partial medial meniscectomy.  Chondroplasty patella.  SURGEON:  Loreta Ave, M.D.  ASSISTANT:  Mikey Kirschner, PA.  ANESTHESIA:  General.  BLOOD LOSS:  Minimal.  SPECIMENS:  None.  CULTURES:  None.  COMPLICATIONS:  None.  DRESSINGS:  Soft compressive.  TOURNIQUET:  Not employed.  DESCRIPTION OF PROCEDURE:  The patient was brought to the operating room, placed on the operating table in supine position.  After adequate anesthesia had been obtained, knee examined.  Full motion.  Good stability.  Leg holder applied.  Prepped and draped in usual sterile fashion.  Two portals, one each medial and lateral parapatellar. Arthroscope was introduced.  Knee distended and inspected.  Good patellar tracking and some focal grade II and III chondromalacia very medial patella debrided.  Trochlea looked good.  Remaining articular cartilage looked intact throughout.  ACL intact.  Lateral meniscus was intact.  Medial meniscus radial tear to posterior horn allowing this displace out.  Not reparable.  This was saucerized out to the rim. Tapered into remaining meniscus, salvaging most of the anterior 2/3rd. Entire knee examined.  No other findings were appreciated.  Instruments and fluid removed. Portals were closed with nylon.  Sterile compressive dressing applied. Intra-articular injection Depo-Medrol and Marcaine before  addressing. Anesthesia reversed.  Brought to the recovery room.  Tolerated the surgery well.  No complications.     Loreta Ave, M.D.     DFM/MEDQ  D:  05/30/2015  T:  05/30/2015  Job:  [id number]

## 2018-11-02 ENCOUNTER — Other Ambulatory Visit: Payer: Self-pay | Admitting: Gastroenterology

## 2018-11-02 DIAGNOSIS — R1310 Dysphagia, unspecified: Secondary | ICD-10-CM

## 2018-11-07 ENCOUNTER — Other Ambulatory Visit: Payer: Medicare Other

## 2018-11-14 ENCOUNTER — Other Ambulatory Visit: Payer: Medicare Other

## 2018-12-09 ENCOUNTER — Encounter (HOSPITAL_COMMUNITY)
Admission: EM | Disposition: A | Discharge status: Home / Self Care | Payer: Self-pay | Source: Home / Self Care | Attending: Emergency Medicine

## 2018-12-09 ENCOUNTER — Emergency Department (HOSPITAL_COMMUNITY)
Admission: EM | Admit: 2018-12-09 | Discharge: 2018-12-09 | Disposition: A | Discharge status: Home / Self Care | Payer: Medicare Other | Attending: Emergency Medicine | Admitting: Emergency Medicine

## 2018-12-09 ENCOUNTER — Encounter (HOSPITAL_COMMUNITY): Payer: Self-pay

## 2018-12-09 ENCOUNTER — Encounter (HOSPITAL_COMMUNITY): Payer: Self-pay | Admitting: Certified Registered Nurse Anesthetist

## 2018-12-09 ENCOUNTER — Other Ambulatory Visit: Payer: Self-pay

## 2018-12-09 DIAGNOSIS — Z20828 Contact with and (suspected) exposure to other viral communicable diseases: Secondary | ICD-10-CM | POA: Diagnosis not present

## 2018-12-09 DIAGNOSIS — Y33XXXA Other specified events, undetermined intent, initial encounter: Secondary | ICD-10-CM | POA: Diagnosis not present

## 2018-12-09 DIAGNOSIS — Z79899 Other long term (current) drug therapy: Secondary | ICD-10-CM | POA: Diagnosis not present

## 2018-12-09 DIAGNOSIS — Y929 Unspecified place or not applicable: Secondary | ICD-10-CM | POA: Diagnosis not present

## 2018-12-09 DIAGNOSIS — R1310 Dysphagia, unspecified: Secondary | ICD-10-CM | POA: Diagnosis present

## 2018-12-09 DIAGNOSIS — T18198A Other foreign object in esophagus causing other injury, initial encounter: Secondary | ICD-10-CM | POA: Diagnosis not present

## 2018-12-09 DIAGNOSIS — Y999 Unspecified external cause status: Secondary | ICD-10-CM | POA: Insufficient documentation

## 2018-12-09 DIAGNOSIS — K222 Esophageal obstruction: Secondary | ICD-10-CM | POA: Diagnosis not present

## 2018-12-09 DIAGNOSIS — T18108A Unspecified foreign body in esophagus causing other injury, initial encounter: Secondary | ICD-10-CM

## 2018-12-09 DIAGNOSIS — Y9389 Activity, other specified: Secondary | ICD-10-CM | POA: Diagnosis not present

## 2018-12-09 HISTORY — PX: ESOPHAGOGASTRODUODENOSCOPY (EGD) WITH PROPOFOL: SHX5813

## 2018-12-09 HISTORY — PX: FOREIGN BODY REMOVAL: SHX962

## 2018-12-09 LAB — CBC WITH DIFFERENTIAL/PLATELET
Abs Immature Granulocytes: 0.02 10*3/uL (ref 0.00–0.07)
Basophils Absolute: 0 10*3/uL (ref 0.0–0.1)
Basophils Relative: 0 %
Eosinophils Absolute: 0.2 10*3/uL (ref 0.0–0.5)
Eosinophils Relative: 2 %
HCT: 41.3 % (ref 39.0–52.0)
Hemoglobin: 13.1 g/dL (ref 13.0–17.0)
Immature Granulocytes: 0 %
Lymphocytes Relative: 16 %
Lymphs Abs: 1.2 10*3/uL (ref 0.7–4.0)
MCH: 30.5 pg (ref 26.0–34.0)
MCHC: 31.7 g/dL (ref 30.0–36.0)
MCV: 96.3 fL (ref 80.0–100.0)
Monocytes Absolute: 0.6 10*3/uL (ref 0.1–1.0)
Monocytes Relative: 9 %
Neutro Abs: 5.3 10*3/uL (ref 1.7–7.7)
Neutrophils Relative %: 73 %
Platelets: 204 10*3/uL (ref 150–400)
RBC: 4.29 MIL/uL (ref 4.22–5.81)
RDW: 14.6 % (ref 11.5–15.5)
WBC: 7.3 10*3/uL (ref 4.0–10.5)
nRBC: 0 % (ref 0.0–0.2)

## 2018-12-09 LAB — SARS CORONAVIRUS 2 BY RT PCR (HOSPITAL ORDER, PERFORMED IN ~~LOC~~ HOSPITAL LAB): SARS Coronavirus 2: NEGATIVE

## 2018-12-09 LAB — BASIC METABOLIC PANEL
Anion gap: 11 (ref 5–15)
BUN: 14 mg/dL (ref 8–23)
CO2: 24 mmol/L (ref 22–32)
Calcium: 8.8 mg/dL — ABNORMAL LOW (ref 8.9–10.3)
Chloride: 105 mmol/L (ref 98–111)
Creatinine, Ser: 1.6 mg/dL — ABNORMAL HIGH (ref 0.61–1.24)
GFR calc Af Amer: 50 mL/min — ABNORMAL LOW (ref >60)
GFR calc non Af Amer: 43 mL/min — ABNORMAL LOW (ref >60)
Glucose, Bld: 86 mg/dL (ref 70–99)
Potassium: 3.3 mmol/L — ABNORMAL LOW (ref 3.5–5.1)
Sodium: 140 mmol/L (ref 135–145)

## 2018-12-09 SURGERY — ESOPHAGOGASTRODUODENOSCOPY (EGD) WITH PROPOFOL
Anesthesia: Moderate Sedation

## 2018-12-09 MED ORDER — MIDAZOLAM HCL (PF) 5 MG/ML IJ SOLN
INTRAMUSCULAR | Status: AC
  Filled 2018-12-09: qty 3

## 2018-12-09 MED ORDER — DIPHENHYDRAMINE HCL 50 MG/ML IJ SOLN
INTRAMUSCULAR | Status: AC
  Filled 2018-12-09: qty 1

## 2018-12-09 MED ORDER — GLUCAGON HCL RDNA (DIAGNOSTIC) 1 MG IJ SOLR
1.0000 mg | Freq: Once | INTRAMUSCULAR | Status: AC
  Administered 2018-12-09: 1 mg via INTRAVENOUS
  Filled 2018-12-09: qty 1

## 2018-12-09 MED ORDER — MIDAZOLAM HCL (PF) 10 MG/2ML IJ SOLN
INTRAMUSCULAR | Status: DC | PRN
  Administered 2018-12-09: 1 mg via INTRAVENOUS
  Administered 2018-12-09 (×2): 2 mg via INTRAVENOUS

## 2018-12-09 MED ORDER — BUTAMBEN-TETRACAINE-BENZOCAINE 2-2-14 % EX AERO
INHALATION_SPRAY | CUTANEOUS | Status: DC | PRN
  Administered 2018-12-09: 2 via TOPICAL

## 2018-12-09 MED ORDER — SODIUM CHLORIDE 0.9 % IV SOLN: INTRAVENOUS | Status: DC

## 2018-12-09 MED ORDER — FENTANYL CITRATE (PF) 100 MCG/2ML IJ SOLN
INTRAMUSCULAR | Status: AC
  Filled 2018-12-09: qty 4

## 2018-12-09 MED ORDER — SODIUM CHLORIDE 0.9 % IV BOLUS
500.0000 mL | Freq: Once | INTRAVENOUS | Status: AC
  Administered 2018-12-09: 16:00:00 500 mL via INTRAVENOUS

## 2018-12-09 MED ORDER — SODIUM CHLORIDE 0.9 % IV SOLN
INTRAVENOUS | Status: DC
  Administered 2018-12-09: 16:00:00 via INTRAVENOUS

## 2018-12-09 MED ORDER — FENTANYL CITRATE (PF) 100 MCG/2ML IJ SOLN
INTRAMUSCULAR | Status: DC | PRN
  Administered 2018-12-09 (×3): 25 ug via INTRAVENOUS

## 2018-12-09 SURGICAL SUPPLY — 15 items

## 2018-12-09 NOTE — Op Note (Signed)
[facility] Patient Name: Jeffrey CosJeffrey Capece Procedure Date: 12/09/2018 MRN: [id number] Attending MD: Vida RiggerMarc Mekayla Soman , MD Date of Birth: 03/12/1946 CSN: [id number] Age: 73 Admit Type: Outpatient Procedure:                Upper GI endoscopy Indications:              Foreign body in the esophagus probably pill in                            patient with known ring Providers:                Vida RiggerMarc Doshie Maggi, MD, Zoe LanJenny Kappus, RN, Arlee Muslimhris Chandler,                            Technician Referring MD:              Medicines:                Fentanyl 75 micrograms IV, Midazolam 5 mg IV,                            Cetacaine spray Complications:            No immediate complications. Estimated Blood Loss:     Minimal from an maneuver as above Procedure:                Pre-Anesthesia Assessment:                           - Prior to the procedure, a History and Physical                            was performed, and patient medications and                            allergies were reviewed. The patient's tolerance of                            previous anesthesia was also reviewed. The risks                            and benefits of the procedure and the sedation                            options and risks were discussed with the patient.                            All questions were answered, and informed consent                            was obtained. Prior Anticoagulants: The patient has                            taken no previous anticoagulant or antiplatelet  agents. ASA Grade Assessment: I - A normal, healthy                            patient. After reviewing the risks and benefits,                            the patient was deemed in satisfactory condition to                            undergo the procedure.                           After obtaining informed consent, the endoscope was                            passed under direct vision. Throughout the                      procedure, the patient's blood pressure, pulse, and                            oxygen saturations were monitored continuously. The                            GIF-H190 ([id number] Olympus gastroscope was                            introduced through the mouth, and advanced to the                            second part of duodenum. The upper GI endoscopy was                            accomplished without difficulty. The patient                            tolerated the procedure well. Scope In: Scope Out: Findings:      Pill were found in the lower third of the esophagus. Removal was       accomplished with a Roth net.      One benign-appearing, intrinsic moderate stenosis was found. The       stenosis was traversed with gentle pressure and disruption of ring and       minimal heme.      The entire examined stomach was normal.      The duodenal bulb, first portion of the duodenum and second portion of       the duodenum were normal.      The exam was otherwise without abnormality. Impression:               - Pill were found in the esophagus. Removal was                            successful.                           -  Benign-appearing esophageal stenosis.                           - Normal stomach.                           - Normal duodenal bulb, first portion of the                            duodenum and second portion of the duodenum.                           - The examination was otherwise normal. Moderate Sedation:      Moderate (conscious) sedation was administered by the endoscopy nurse       and supervised by the endoscopist. The following parameters were       monitored: oxygen saturation, heart rate, blood pressure, and response       to care. Recommendation:           - Patient has a contact number available for                            emergencies. The signs and symptoms of potential                            delayed complications were discussed with  the                            patient. Return to normal activities tomorrow.                            Written discharge instructions were provided to the                            patient.                           - Clear liquid diet for 2 hours. If okay then soft                            solids tonight                           - Continue present medications.                           - Return to GI clinic PRN. Dilation per Dr. Leonard SchwartzB in                            few weeks                           - Telephone GI clinic if symptomatic PRN. Procedure Code(s):        --- Professional ---  [phone], Esophagogastroduodenoscopy, flexible,                            transoral; with removal of foreign body(s) Diagnosis Code(s):        --- Professional ---                           [id number], Other foreign object in esophagus causing                            other injury, initial encounter                           K22.2, Esophageal obstruction                           T18.108A, Unspecified foreign body in esophagus                            causing other injury, initial encounter CPT copyright 2019 American Medical Association. All rights reserved. The codes documented in this report are preliminary and upon coder review may  be revised to meet current compliance requirements. Clarene Essex, MD 12/09/2018 6:22:17 PM This report has been signed electronically. Number of Addenda: 0

## 2018-12-09 NOTE — ED Notes (Signed)
Procedure at bedside

## 2018-12-09 NOTE — ED Provider Notes (Signed)
[facility] DEPT Provider Note   CSN: [id number] Arrival date & time: 12/09/18  1437     History   Chief Complaint Chief Complaint  Patient presents with  . Trouble swallowing    pill stuc, esophageal stricture     HPI Jeffrey Mejia is a 73 y.o. male.     Patient's gastroenterologist sent him to the emergency department because he had a pill stuck in his throat.  He has had problems with eating his throat dilated before.  Patient states he can sip water but it hurts.  This is been in his throat for 2 days  The history is provided by the patient.  Swallowed Foreign Body This is a new problem. The current episode started 2 days ago. The problem occurs constantly. The problem has not changed since onset.Pertinent negatives include no chest pain, no abdominal pain and no headaches. Nothing aggravates the symptoms. Nothing relieves the symptoms. He has tried nothing for the symptoms. The treatment provided no relief.    Past Medical History:  Diagnosis Date  . Anemia   . Arthritis   . Crohn's ileitis ([place])   . Esophageal stricture   . GERD (gastroesophageal reflux disease)   . History of stomach ulcers   . Osteopenia    on no current medication  . Rosacea     There are no active problems to display for this patient.   Past Surgical History:  Procedure Laterality Date  . APPENDECTOMY    . BALLOON DILATION     x1 before  . BALLOON DILATION  08/11/2011   Procedure: BALLOON DILATION;  Surgeon: Cleotis Nipper, MD;  Location: [facility];  Service: Endoscopy;  Laterality: N/A;  . COLON SURGERY    . COLONOSCOPY  08/11/2011   Procedure: COLONOSCOPY;  Surgeon: Cleotis Nipper, MD;  Location: [facility];  Service: Endoscopy;  Laterality: N/A;  . ESOPHAGOGASTRODUODENOSCOPY  08/11/2011   Procedure: ESOPHAGOGASTRODUODENOSCOPY (EGD);  Surgeon: Cleotis Nipper, MD;  Location: [facility];  Service: Endoscopy;  Laterality: N/A;  . KNEE ARTHROSCOPY  WITH MEDIAL MENISECTOMY Right 05/30/2015   Procedure: RIGHT KNEE ARTHROSCOPY CHONDROPLASTY, MEDIAL MENISECTOMY;  Surgeon: Ninetta Lights, MD;  Location: [facility];  Service: Orthopedics;  Laterality: Right;  . 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 [place]  . TONSILLECTOMY          Home Medications    Prior to Admission medications   Medication Sig Start Date End Date Taking? Authorizing Provider  bifidobacterium infantis (ALIGN) capsule Take 1 capsule by mouth daily.   Yes [provider]  cholecalciferol (VITAMIN D) 1000 UNITS tablet Take 2,000 Units by mouth 2 (two) times daily.   Yes [provider]  clotrimazole (MYCELEX) 10 MG troche Take 10 mg by mouth 2 (two) times daily.    Yes [provider]  Cyanocobalamin 1000 MCG/ML LIQD Inject 1 mL into the skin every 30 (thirty) days.   Yes [provider]  fexofenadine (ALLEGRA) 180 MG tablet Take 180 mg by mouth daily.   Yes [provider]  metroNIDAZOLE (METROGEL) 0.75 % gel Apply topically daily. Nose and face for rosacea   Yes [provider]  pantoprazole (PROTONIX) 20 MG tablet Take 20 mg by mouth daily.   Yes [provider]  sodium chloride 0.9 % SOLN with inFLIXimab 100 MG SOLR Inject into the vein every 28 (twenty-eight) days. For crohn's disease  Yes [provider]  ondansetron (ZOFRAN) 4 MG tablet Take 1 tablet (4 mg total) by mouth every 8 (eight) hours as needed for nausea or vomiting. Patient not taking: Reported on 12/09/2018 05/30/15   Cristie HemStanbery, Mary L, PA-C  oxyCODONE-acetaminophen (ROXICET) 5-325 MG tablet Take 1-2 tablets by mouth every 4 (four) hours as needed. Patient not taking: Reported on 12/09/2018 05/30/15   Cristie HemStanbery, Mary L, PA-C    Family History Family History  Problem Relation Age of Onset  . Anesthesia problems Neg Hx   . Hypotension Neg Hx   . Malignant hyperthermia Neg Hx   . Pseudochol  deficiency Neg Hx     Social History Social History   Tobacco Use  . Smoking status: Never Smoker  . Smokeless tobacco: Never Used  Substance Use Topics  . Alcohol use: Yes    Alcohol/week: 2.0 standard drinks    Types: 2 Glasses of wine per week    Comment: couple glasses wine per week  . Drug use: No     Allergies   Patient has no known allergies.   Review of Systems Review of Systems  Constitutional: Negative for appetite change and fatigue.  HENT: Negative for congestion, ear discharge and sinus pressure.        Throat discomfort  Eyes: Negative for discharge.  Respiratory: Negative for cough.   Cardiovascular: Negative for chest pain.  Gastrointestinal: Negative for abdominal pain and diarrhea.  Genitourinary: Negative for frequency and hematuria.  Musculoskeletal: Negative for back pain.  Skin: Negative for rash.  Neurological: Negative for seizures and headaches.  Psychiatric/Behavioral: Negative for hallucinations.     Physical Exam Updated Vital Signs BP (!) 108/53   Pulse 85   Temp 98.4 F (36.9 C) (Oral)   Resp 20   SpO2 99%   Physical Exam Vitals signs and nursing note reviewed.  Constitutional:      Appearance: He is well-developed.  HENT:     Head: Normocephalic.     Nose: Nose normal.  Eyes:     General: No scleral icterus.    Conjunctiva/sclera: Conjunctivae normal.  Neck:     Musculoskeletal: Neck supple.     Thyroid: No thyromegaly.  Cardiovascular:     Rate and Rhythm: Normal rate and regular rhythm.     Heart sounds: No murmur. No friction rub. No gallop.   Pulmonary:     Breath sounds: No stridor. No wheezing or rales.  Chest:     Chest wall: No tenderness.  Abdominal:     General: There is no distension.     Tenderness: There is no abdominal tenderness. There is no rebound.  Musculoskeletal: Normal range of motion.  Lymphadenopathy:     Cervical: No cervical adenopathy.  Skin:    Findings: No erythema or rash.   Neurological:     Mental Status: He is oriented to person, place, and time.     Motor: No abnormal muscle tone.     Coordination: Coordination normal.  Psychiatric:        Behavior: Behavior normal.      ED Treatments / Results  Labs (all labs ordered are listed, but only abnormal results are displayed) Labs Reviewed  BASIC METABOLIC PANEL - Abnormal; Notable for the following components:      Result Value   Potassium 3.3 (*)    Creatinine, Ser 1.60 (*)    Calcium 8.8 (*)    GFR calc non Af Amer 43 (*)    GFR calc  Af Amer 50 (*)    All other components within normal limits  SARS CORONAVIRUS 2 (HOSPITAL ORDER, PERFORMED IN [facility])  CBC WITH DIFFERENTIAL/PLATELET    EKG None  Radiology No results found.  Procedures Procedures (including critical care time)  Medications Ordered in ED Medications  0.9 %  sodium chloride infusion ( Intravenous New Bag/Given 12/09/18 1553)  sodium chloride 0.9 % bolus 500 mL (0 mLs Intravenous Stopped 12/09/18 1649)  glucagon (human recombinant) (GLUCAGEN) injection 1 mg (1 mg Intravenous Given 12/09/18 1554)  diphenhydrAMINE (BENADRYL) 50 MG/ML injection (has no administration in time range)  fentaNYL (SUBLIMAZE) 100 MCG/2ML injection (has no administration in time range)  midazolam PF (VERSED) 5 MG/ML injection (has no administration in time range)     Initial Impression / Assessment and Plan / ED Course  I have reviewed the triage vital signs and the nursing notes.  Pertinent labs & imaging results that were available during my care of the patient were reviewed by me and considered in my medical decision making (see chart for details).       Labs unremarkable.  Cover test negative.  Glucagon did not help the patient.  He still had the pill stuck in his esophagus.  GI did endoscopy on the patient and removed the pill.  He will follow-up with GI.  Patient tolerated the procedure very well  Final Clinical Impressions(s)  / ED Diagnoses   Final diagnoses:  Foreign body in esophagus, initial encounter    ED Discharge Orders    None       Bethann Berkshire, MD 12/09/18 2019

## 2018-12-09 NOTE — ED Triage Notes (Addendum)
Patient states he had an obstruction in his throat. States he has esophageal stricture.   Patient states he has an iron pill stuck in his throat on Tuesday but has moved down.     1/10 uncomfortable pain.    told to come here by PCP to have it removed.  A/ox4 Ambulatory in triage.   Patient states this happens all the time but usually resolved its self. Patient has had to have an endoscopy 6 years ago to remove an obstruction in past.   Denies shob or trouble breathing at this time.

## 2018-12-09 NOTE — Consult Note (Signed)
Reason for Consult: Food impaction Referring Physician: ER physician  Jeffrey Mejia is an 73 y.o. male.  HPI: Patient seen and examined in his office computer chart reviewed and his case discussed with my partner Dr. B and his previous endoscopies reviewed and a day and a half ago he felt like his iron pill got caught and it still felt like it was caught earlier today and he did a video health visit with Dr. B and it was determined for him to come to the emergency room and have an endoscopy and probable foreign body removal and he is not on any aspirin or blood thinners and has had dilations in the past  Past Medical History:  Diagnosis Date  . Anemia   . Arthritis   . Crohn's ileitis ([facility])   . Esophageal stricture   . GERD (gastroesophageal reflux disease)   . History of stomach ulcers   . Osteopenia    on no current medication  . Rosacea     Past Surgical History:  Procedure Laterality Date  . APPENDECTOMY    . BALLOON DILATION     x1 before  . BALLOON DILATION  08/11/2011   Procedure: BALLOON DILATION;  Surgeon: Florencia Reasons, MD;  Location: [facility];  Service: Endoscopy;  Laterality: N/A;  . COLON SURGERY    . COLONOSCOPY  08/11/2011   Procedure: COLONOSCOPY;  Surgeon: Florencia Reasons, MD;  Location: [facility];  Service: Endoscopy;  Laterality: N/A;  . ESOPHAGOGASTRODUODENOSCOPY  08/11/2011   Procedure: ESOPHAGOGASTRODUODENOSCOPY (EGD);  Surgeon: Florencia Reasons, MD;  Location: [facility];  Service: Endoscopy;  Laterality: N/A;  . KNEE ARTHROSCOPY WITH MEDIAL MENISECTOMY Right 05/30/2015   Procedure: RIGHT KNEE ARTHROSCOPY CHONDROPLASTY, MEDIAL MENISECTOMY;  Surgeon: Loreta Ave, MD;  Location: [facility];  Service: Orthopedics;  Laterality: Right;  . melanoma removal     from back, age 49  . SMALL INTESTINE SURGERY  1993  . TONSILLECTOMY      Family History  Problem Relation Age of Onset  . Anesthesia problems Neg Hx   . Hypotension Neg Hx    . Malignant hyperthermia Neg Hx   . Pseudochol deficiency Neg Hx     Social History:  reports that he has never smoked. He has never used smokeless tobacco. He reports current alcohol use of about 2.0 standard drinks of alcohol per week. He reports that he does not use drugs.  Allergies: No Known Allergies  Medications: I have reviewed the patient's current medications.  Results for orders placed or performed during the hospital encounter of 12/09/18 (from the past 48 hour(s))  CBC with Differential     Status: None   Collection Time: 12/09/18  3:55 PM  Result Value Ref Range   WBC 7.3 4.0 - 10.5 K/uL   RBC 4.29 4.22 - 5.81 MIL/uL   Hemoglobin 13.1 13.0 - 17.0 g/dL   HCT 33.4 35.6 - 86.1 %   MCV 96.3 80.0 - 100.0 fL   MCH 30.5 26.0 - 34.0 pg   MCHC 31.7 30.0 - 36.0 g/dL   RDW 68.3 72.9 - 02.1 %   Platelets 204 150 - 400 K/uL   nRBC 0.0 0.0 - 0.2 %   Neutrophils Relative % 73 %   Neutro Abs 5.3 1.7 - 7.7 K/uL   Lymphocytes Relative 16 %   Lymphs Abs 1.2 0.7 - 4.0 K/uL   Monocytes Relative 9 %   Monocytes Absolute 0.6 0.1 - 1.0 K/uL  Eosinophils Relative 2 %   Eosinophils Absolute 0.2 0.0 - 0.5 K/uL   Basophils Relative 0 %   Basophils Absolute 0.0 0.0 - 0.1 K/uL   Immature Granulocytes 0 %   Abs Immature Granulocytes 0.02 0.00 - 0.07 K/uL    Comment: Performed at [facility], [place] [address] [place], [place] [address]  Basic metabolic panel     Status: Abnormal   Collection Time: 12/09/18  3:55 PM  Result Value Ref Range   Sodium 140 135 - 145 mmol/L   Potassium 3.3 (L) 3.5 - 5.1 mmol/L   Chloride 105 98 - 111 mmol/L   CO2 24 22 - 32 mmol/L   Glucose, Bld 86 70 - 99 mg/dL   BUN 14 8 - 23 mg/dL   Creatinine, Ser 1.60 (H) 0.61 - 1.24 mg/dL   Calcium 8.8 (L) 8.9 - 10.3 mg/dL   GFR calc non Af Amer 43 (L) >60 mL/min   GFR calc Af Amer 50 (L) >60 mL/min   Anion gap 11 5 - 15    Comment: Performed at [facility], [place] [address] [place], [place] [address]  SARS Coronavirus 2 [facility] order, Performed in [facility] hospital lab) Nasopharyngeal Nasopharyngeal Swab     Status: None   Collection Time: 12/09/18  3:55 PM   Specimen: Nasopharyngeal Swab  Result Value Ref Range   SARS Coronavirus 2 NEGATIVE NEGATIVE    Comment: (NOTE) If result is NEGATIVE SARS-CoV-2 target nucleic acids are NOT DETECTED. The SARS-CoV-2 RNA is generally detectable in upper and lower  respiratory specimens during the acute phase of infection. The lowest  concentration of SARS-CoV-2 viral copies this assay can detect is 250  copies / mL. A negative result does not preclude SARS-CoV-2 infection  and should not be used as the sole basis for treatment or other  patient management decisions.  A negative result may occur with  improper specimen collection / handling, submission of specimen other  than nasopharyngeal swab, presence of viral mutation(s) within the  areas targeted by this assay, and inadequate number of viral copies  (<250 copies / mL). A negative result must be combined with clinical  observations, patient history, and epidemiological information. If result is POSITIVE SARS-CoV-2 target nucleic acids are DETECTED. The SARS-CoV-2 RNA is generally detectable in upper and lower  respiratory specimens dur ing the acute phase of infection.  Positive  results are indicative of active infection with SARS-CoV-2.  Clinical  correlation with patient history and other diagnostic information is  necessary to determine patient infection status.  Positive results do  not rule out bacterial infection or co-infection with other viruses. If result is PRESUMPTIVE POSTIVE SARS-CoV-2 nucleic acids MAY BE PRESENT.   A presumptive positive result was obtained on the submitted specimen  and confirmed on repeat testing.  While 2019 novel coronavirus  (SARS-CoV-2) nucleic acids may be present in the submitted sample  additional confirmatory  testing may be necessary for epidemiological  and / or clinical management purposes  to differentiate between  SARS-CoV-2 and other Sarbecovirus currently known to infect humans.  If clinically indicated additional testing with an alternate test  methodology [phone]) is advised. The SARS-CoV-2 RNA is generally  detectable in upper and lower respiratory sp ecimens during the acute  phase of infection. The expected result is Negative. Fact Sheet for Patients:  StrictlyIdeas.no Fact Sheet for Healthcare Providers: BankingDealers.co.za This test is not yet approved or cleared by the [place]  FDA and has been authorized for detection and/or diagnosis of SARS-CoV-2 by FDA under an Emergency Use Authorization (EUA).  This EUA will remain in effect (meaning this test can be used) for the duration of the COVID-19 declaration under Section 564(b)(1) of the Act, 21 U.S.C. section 360bbb-3(b)(1), unless the authorization is terminated or revoked sooner. Performed at [facility], [address] [place], [place] [address]     No results found.  ROS Blood pressure (!) 108/53, pulse 71, temperature 98.4 F (36.9 C), temperature source Oral, resp. rate (!) 22, SpO2 100 %. Physical Exam no acute distress vital signs stable exam please see preassessment evaluation labs okay COVID negative  Assessment/Plan: Probable food impaction Plan: The risks benefits methods of endoscopy was discussed with the patient and will proceed ASAP with further work-up and plans pending those findings  Kemo Spruce E 12/09/2018, 6:25 PM

## 2018-12-09 NOTE — Discharge Instructions (Signed)
YOU HAD AN ENDOSCOPIC PROCEDURE TODAY: Refer to the procedure report and other information in the discharge instructions given to you for any specific questions about what was found during the examination. If this information does not answer your questions, please call [facility] office at [phone] to clarify.   YOU SHOULD EXPECT: Some feelings of bloating in the abdomen. Passage of more gas than usual. Walking can help get rid of the air that was put into your GI tract during the procedure and reduce the bloating. If you had a lower endoscopy (such as a colonoscopy or flexible sigmoidoscopy) you may notice spotting of blood in your stool or on the toilet paper. Some abdominal soreness may be present for a day or two, also.  DIET: Your first meal following the procedure should be a light meal and then it is ok to progress to your normal diet. A half-sandwich or bowl of soup is an example of a good first meal. Heavy or fried foods are harder to digest and may make you feel nauseous or bloated. Drink plenty of fluids but you should avoid alcoholic beverages for 24 hours. If you had a esophageal dilation, please see attached instructions for diet.   ACTIVITY: Your care partner should take you home directly after the procedure. You should plan to take it easy, moving slowly for the rest of the day. You can resume normal activity the day after the procedure however YOU SHOULD NOT DRIVE, use power tools, machinery or perform tasks that involve climbing or major physical exertion for 24 hours (because of the sedation medicines used during the test).   SYMPTOMS TO REPORT IMMEDIATELY: A gastroenterologist can be reached at any hour. Please call [phone]  for any of the following symptoms:   Following upper endoscopy (EGD, EUS, ERCP, esophageal dilation) Vomiting of blood or coffee ground material  New, significant abdominal pain  New, significant chest pain or pain under the shoulder blades  Painful or  persistently difficult swallowing  New shortness of breath  Black, tarry-looking or red, bloody stools  FOLLOW UP:  If any biopsies were taken you will be contacted by phone or by letter within the next 1-3 weeks. Call [phone]  if you have not heard about the biopsies in 3 weeks.  Please also call with any specific questions about appointments or follow up tests. Call if question or problem otherwise clear liquids only for 2 hours and if doing well may slowly advance to soft solids tonight and tomorrow and call Dr. Cristina Gong next week to discuss outpatient dilation at your convenience

## 2018-12-09 NOTE — ED Notes (Signed)
MD at bedside. 

## 2018-12-12 ENCOUNTER — Encounter (HOSPITAL_COMMUNITY): Payer: Self-pay | Admitting: Gastroenterology

## 2018-12-12 ENCOUNTER — Other Ambulatory Visit: Payer: Self-pay | Admitting: Gastroenterology

## 2018-12-12 ENCOUNTER — Ambulatory Visit
Admission: RE | Admit: 2018-12-12 | Discharge: 2018-12-12 | Disposition: A | Discharge status: Home / Self Care | Payer: Medicare Other | Source: Ambulatory Visit | Attending: Gastroenterology | Admitting: Gastroenterology

## 2018-12-12 DIAGNOSIS — R1310 Dysphagia, unspecified: Secondary | ICD-10-CM

## 2019-07-03 DIAGNOSIS — K5090 Crohn's disease, unspecified, without complications: Secondary | ICD-10-CM | POA: Diagnosis not present

## 2019-07-03 DIAGNOSIS — K5010 Crohn's disease of large intestine without complications: Secondary | ICD-10-CM | POA: Diagnosis not present

## 2019-07-03 DIAGNOSIS — E538 Deficiency of other specified B group vitamins: Secondary | ICD-10-CM | POA: Diagnosis not present

## 2019-07-20 DIAGNOSIS — H2513 Age-related nuclear cataract, bilateral: Secondary | ICD-10-CM | POA: Diagnosis not present

## 2019-07-20 DIAGNOSIS — H5203 Hypermetropia, bilateral: Secondary | ICD-10-CM | POA: Diagnosis not present

## 2019-07-20 DIAGNOSIS — H52203 Unspecified astigmatism, bilateral: Secondary | ICD-10-CM | POA: Diagnosis not present

## 2019-07-20 DIAGNOSIS — Z1159 Encounter for screening for other viral diseases: Secondary | ICD-10-CM | POA: Diagnosis not present

## 2019-07-25 DIAGNOSIS — K222 Esophageal obstruction: Secondary | ICD-10-CM | POA: Diagnosis not present

## 2019-07-25 DIAGNOSIS — R1314 Dysphagia, pharyngoesophageal phase: Secondary | ICD-10-CM | POA: Diagnosis not present

## 2019-08-01 DIAGNOSIS — K5010 Crohn's disease of large intestine without complications: Secondary | ICD-10-CM | POA: Diagnosis not present

## 2019-08-01 DIAGNOSIS — E538 Deficiency of other specified B group vitamins: Secondary | ICD-10-CM | POA: Diagnosis not present

## 2019-08-29 DIAGNOSIS — K5010 Crohn's disease of large intestine without complications: Secondary | ICD-10-CM | POA: Diagnosis not present

## 2019-08-29 DIAGNOSIS — E538 Deficiency of other specified B group vitamins: Secondary | ICD-10-CM | POA: Diagnosis not present

## 2019-09-01 DIAGNOSIS — H9319 Tinnitus, unspecified ear: Secondary | ICD-10-CM | POA: Diagnosis not present

## 2019-09-04 DIAGNOSIS — H9319 Tinnitus, unspecified ear: Secondary | ICD-10-CM | POA: Diagnosis not present

## 2019-09-14 DIAGNOSIS — N183 Chronic kidney disease, stage 3 unspecified: Secondary | ICD-10-CM | POA: Diagnosis not present

## 2019-09-20 DIAGNOSIS — E559 Vitamin D deficiency, unspecified: Secondary | ICD-10-CM | POA: Diagnosis not present

## 2019-09-20 DIAGNOSIS — N183 Chronic kidney disease, stage 3 unspecified: Secondary | ICD-10-CM | POA: Diagnosis not present

## 2019-09-20 DIAGNOSIS — D631 Anemia in chronic kidney disease: Secondary | ICD-10-CM | POA: Diagnosis not present

## 2019-09-26 DIAGNOSIS — K5010 Crohn's disease of large intestine without complications: Secondary | ICD-10-CM | POA: Diagnosis not present

## 2019-09-29 DIAGNOSIS — Z79899 Other long term (current) drug therapy: Secondary | ICD-10-CM | POA: Diagnosis not present

## 2019-09-29 DIAGNOSIS — N189 Chronic kidney disease, unspecified: Secondary | ICD-10-CM | POA: Diagnosis not present

## 2019-09-29 DIAGNOSIS — K5010 Crohn's disease of large intestine without complications: Secondary | ICD-10-CM | POA: Diagnosis not present

## 2019-10-09 DIAGNOSIS — E559 Vitamin D deficiency, unspecified: Secondary | ICD-10-CM | POA: Diagnosis not present

## 2019-10-24 DIAGNOSIS — K5010 Crohn's disease of large intestine without complications: Secondary | ICD-10-CM | POA: Diagnosis not present

## 2019-10-24 DIAGNOSIS — E538 Deficiency of other specified B group vitamins: Secondary | ICD-10-CM | POA: Diagnosis not present

## 2019-11-21 DIAGNOSIS — K5090 Crohn's disease, unspecified, without complications: Secondary | ICD-10-CM | POA: Diagnosis not present

## 2019-11-21 DIAGNOSIS — K5010 Crohn's disease of large intestine without complications: Secondary | ICD-10-CM | POA: Diagnosis not present

## 2019-11-21 DIAGNOSIS — E538 Deficiency of other specified B group vitamins: Secondary | ICD-10-CM | POA: Diagnosis not present

## 2019-11-28 DIAGNOSIS — L905 Scar conditions and fibrosis of skin: Secondary | ICD-10-CM | POA: Diagnosis not present

## 2019-11-28 DIAGNOSIS — L570 Actinic keratosis: Secondary | ICD-10-CM | POA: Diagnosis not present

## 2019-11-28 DIAGNOSIS — Z85820 Personal history of malignant melanoma of skin: Secondary | ICD-10-CM | POA: Diagnosis not present

## 2019-11-28 DIAGNOSIS — L819 Disorder of pigmentation, unspecified: Secondary | ICD-10-CM | POA: Diagnosis not present

## 2019-11-28 DIAGNOSIS — L718 Other rosacea: Secondary | ICD-10-CM | POA: Diagnosis not present

## 2019-11-28 DIAGNOSIS — D229 Melanocytic nevi, unspecified: Secondary | ICD-10-CM | POA: Diagnosis not present

## 2019-11-28 DIAGNOSIS — L814 Other melanin hyperpigmentation: Secondary | ICD-10-CM | POA: Diagnosis not present

## 2019-12-08 DIAGNOSIS — E559 Vitamin D deficiency, unspecified: Secondary | ICD-10-CM | POA: Diagnosis not present

## 2019-12-08 DIAGNOSIS — Z79899 Other long term (current) drug therapy: Secondary | ICD-10-CM | POA: Diagnosis not present

## 2019-12-19 DIAGNOSIS — E538 Deficiency of other specified B group vitamins: Secondary | ICD-10-CM | POA: Diagnosis not present

## 2019-12-19 DIAGNOSIS — K5090 Crohn's disease, unspecified, without complications: Secondary | ICD-10-CM | POA: Diagnosis not present

## 2020-01-16 DIAGNOSIS — K5010 Crohn's disease of large intestine without complications: Secondary | ICD-10-CM | POA: Diagnosis not present

## 2020-01-16 DIAGNOSIS — E538 Deficiency of other specified B group vitamins: Secondary | ICD-10-CM | POA: Diagnosis not present

## 2020-02-13 DIAGNOSIS — E538 Deficiency of other specified B group vitamins: Secondary | ICD-10-CM | POA: Diagnosis not present

## 2020-02-13 DIAGNOSIS — K5010 Crohn's disease of large intestine without complications: Secondary | ICD-10-CM | POA: Diagnosis not present

## 2020-02-28 DIAGNOSIS — L570 Actinic keratosis: Secondary | ICD-10-CM | POA: Diagnosis not present

## 2020-02-28 DIAGNOSIS — L821 Other seborrheic keratosis: Secondary | ICD-10-CM | POA: Diagnosis not present

## 2020-02-28 DIAGNOSIS — Z85820 Personal history of malignant melanoma of skin: Secondary | ICD-10-CM | POA: Diagnosis not present

## 2020-02-28 DIAGNOSIS — D485 Neoplasm of uncertain behavior of skin: Secondary | ICD-10-CM | POA: Diagnosis not present

## 2020-02-28 DIAGNOSIS — L298 Other pruritus: Secondary | ICD-10-CM | POA: Diagnosis not present

## 2020-02-28 DIAGNOSIS — Z85828 Personal history of other malignant neoplasm of skin: Secondary | ICD-10-CM | POA: Diagnosis not present

## 2020-02-28 DIAGNOSIS — L905 Scar conditions and fibrosis of skin: Secondary | ICD-10-CM | POA: Diagnosis not present

## 2020-03-12 DIAGNOSIS — E538 Deficiency of other specified B group vitamins: Secondary | ICD-10-CM | POA: Diagnosis not present

## 2020-03-12 DIAGNOSIS — K5010 Crohn's disease of large intestine without complications: Secondary | ICD-10-CM | POA: Diagnosis not present

## 2020-04-08 DIAGNOSIS — L03113 Cellulitis of right upper limb: Secondary | ICD-10-CM | POA: Diagnosis not present

## 2020-04-09 DIAGNOSIS — E538 Deficiency of other specified B group vitamins: Secondary | ICD-10-CM | POA: Diagnosis not present

## 2020-04-09 DIAGNOSIS — K5010 Crohn's disease of large intestine without complications: Secondary | ICD-10-CM | POA: Diagnosis not present

## 2020-04-19 DIAGNOSIS — L03113 Cellulitis of right upper limb: Secondary | ICD-10-CM | POA: Diagnosis not present

## 2020-04-24 DIAGNOSIS — L089 Local infection of the skin and subcutaneous tissue, unspecified: Secondary | ICD-10-CM | POA: Diagnosis not present

## 2020-05-07 DIAGNOSIS — E538 Deficiency of other specified B group vitamins: Secondary | ICD-10-CM | POA: Diagnosis not present

## 2020-05-07 DIAGNOSIS — K5010 Crohn's disease of large intestine without complications: Secondary | ICD-10-CM | POA: Diagnosis not present

## 2020-05-22 DIAGNOSIS — Z01812 Encounter for preprocedural laboratory examination: Secondary | ICD-10-CM | POA: Diagnosis not present

## 2020-05-27 DIAGNOSIS — K208 Other esophagitis without bleeding: Secondary | ICD-10-CM | POA: Diagnosis not present

## 2020-05-27 DIAGNOSIS — K222 Esophageal obstruction: Secondary | ICD-10-CM | POA: Diagnosis not present

## 2020-05-27 DIAGNOSIS — R1314 Dysphagia, pharyngoesophageal phase: Secondary | ICD-10-CM | POA: Diagnosis not present

## 2020-06-03 DIAGNOSIS — Z1389 Encounter for screening for other disorder: Secondary | ICD-10-CM | POA: Diagnosis not present

## 2020-06-03 DIAGNOSIS — Z0000 Encounter for general adult medical examination without abnormal findings: Secondary | ICD-10-CM | POA: Diagnosis not present

## 2020-06-04 DIAGNOSIS — E538 Deficiency of other specified B group vitamins: Secondary | ICD-10-CM | POA: Diagnosis not present

## 2020-06-04 DIAGNOSIS — K5010 Crohn's disease of large intestine without complications: Secondary | ICD-10-CM | POA: Diagnosis not present

## 2020-06-10 DIAGNOSIS — Z1211 Encounter for screening for malignant neoplasm of colon: Secondary | ICD-10-CM | POA: Diagnosis not present

## 2020-06-10 DIAGNOSIS — R1310 Dysphagia, unspecified: Secondary | ICD-10-CM | POA: Diagnosis not present

## 2020-06-10 DIAGNOSIS — K2100 Gastro-esophageal reflux disease with esophagitis, without bleeding: Secondary | ICD-10-CM | POA: Diagnosis not present

## 2020-06-10 DIAGNOSIS — K50019 Crohn's disease of small intestine with unspecified complications: Secondary | ICD-10-CM | POA: Diagnosis not present

## 2020-07-02 DIAGNOSIS — K5090 Crohn's disease, unspecified, without complications: Secondary | ICD-10-CM | POA: Diagnosis not present

## 2021-01-14 DIAGNOSIS — K5010 Crohn's disease of large intestine without complications: Secondary | ICD-10-CM | POA: Diagnosis not present

## 2021-01-14 DIAGNOSIS — K5090 Crohn's disease, unspecified, without complications: Secondary | ICD-10-CM | POA: Diagnosis not present

## 2021-01-15 DIAGNOSIS — J069 Acute upper respiratory infection, unspecified: Secondary | ICD-10-CM | POA: Diagnosis not present

## 2021-02-11 DIAGNOSIS — K5090 Crohn's disease, unspecified, without complications: Secondary | ICD-10-CM | POA: Diagnosis not present

## 2021-03-11 DIAGNOSIS — K5090 Crohn's disease, unspecified, without complications: Secondary | ICD-10-CM | POA: Diagnosis not present

## 2021-03-11 DIAGNOSIS — K5010 Crohn's disease of large intestine without complications: Secondary | ICD-10-CM | POA: Diagnosis not present

## 2021-04-08 DIAGNOSIS — K5090 Crohn's disease, unspecified, without complications: Secondary | ICD-10-CM | POA: Diagnosis not present

## 2021-04-08 DIAGNOSIS — K5010 Crohn's disease of large intestine without complications: Secondary | ICD-10-CM | POA: Diagnosis not present

## 2021-05-06 DIAGNOSIS — K5010 Crohn's disease of large intestine without complications: Secondary | ICD-10-CM | POA: Diagnosis not present

## 2021-05-06 DIAGNOSIS — K5090 Crohn's disease, unspecified, without complications: Secondary | ICD-10-CM | POA: Diagnosis not present

## 2021-06-03 DIAGNOSIS — K5010 Crohn's disease of large intestine without complications: Secondary | ICD-10-CM | POA: Diagnosis not present

## 2021-06-06 DIAGNOSIS — Z1389 Encounter for screening for other disorder: Secondary | ICD-10-CM | POA: Diagnosis not present

## 2021-06-06 DIAGNOSIS — Z0000 Encounter for general adult medical examination without abnormal findings: Secondary | ICD-10-CM | POA: Diagnosis not present

## 2021-06-10 DIAGNOSIS — B079 Viral wart, unspecified: Secondary | ICD-10-CM | POA: Diagnosis not present

## 2021-06-10 DIAGNOSIS — Z85820 Personal history of malignant melanoma of skin: Secondary | ICD-10-CM | POA: Diagnosis not present

## 2021-06-10 DIAGNOSIS — Z85828 Personal history of other malignant neoplasm of skin: Secondary | ICD-10-CM | POA: Diagnosis not present

## 2021-06-10 DIAGNOSIS — D492 Neoplasm of unspecified behavior of bone, soft tissue, and skin: Secondary | ICD-10-CM | POA: Diagnosis not present

## 2021-06-10 DIAGNOSIS — Z08 Encounter for follow-up examination after completed treatment for malignant neoplasm: Secondary | ICD-10-CM | POA: Diagnosis not present

## 2021-06-10 DIAGNOSIS — L819 Disorder of pigmentation, unspecified: Secondary | ICD-10-CM | POA: Diagnosis not present

## 2021-06-10 DIAGNOSIS — L570 Actinic keratosis: Secondary | ICD-10-CM | POA: Diagnosis not present

## 2021-06-10 DIAGNOSIS — R208 Other disturbances of skin sensation: Secondary | ICD-10-CM | POA: Diagnosis not present

## 2021-06-10 DIAGNOSIS — L298 Other pruritus: Secondary | ICD-10-CM | POA: Diagnosis not present

## 2021-06-24 DIAGNOSIS — Z13220 Encounter for screening for lipoid disorders: Secondary | ICD-10-CM | POA: Diagnosis not present

## 2021-06-24 DIAGNOSIS — M8580 Other specified disorders of bone density and structure, unspecified site: Secondary | ICD-10-CM | POA: Diagnosis not present

## 2021-06-24 DIAGNOSIS — Z0000 Encounter for general adult medical examination without abnormal findings: Secondary | ICD-10-CM | POA: Diagnosis not present

## 2021-06-24 DIAGNOSIS — Z136 Encounter for screening for cardiovascular disorders: Secondary | ICD-10-CM | POA: Diagnosis not present

## 2021-06-24 DIAGNOSIS — K50013 Crohn's disease of small intestine with fistula: Secondary | ICD-10-CM | POA: Diagnosis not present

## 2021-06-24 DIAGNOSIS — N189 Chronic kidney disease, unspecified: Secondary | ICD-10-CM | POA: Diagnosis not present

## 2021-06-24 DIAGNOSIS — E538 Deficiency of other specified B group vitamins: Secondary | ICD-10-CM | POA: Diagnosis not present

## 2021-06-24 DIAGNOSIS — Z125 Encounter for screening for malignant neoplasm of prostate: Secondary | ICD-10-CM | POA: Diagnosis not present

## 2021-06-24 DIAGNOSIS — K2100 Gastro-esophageal reflux disease with esophagitis, without bleeding: Secondary | ICD-10-CM | POA: Diagnosis not present

## 2021-06-24 DIAGNOSIS — E559 Vitamin D deficiency, unspecified: Secondary | ICD-10-CM | POA: Diagnosis not present

## 2021-07-01 DIAGNOSIS — K5090 Crohn's disease, unspecified, without complications: Secondary | ICD-10-CM | POA: Diagnosis not present

## 2021-07-01 DIAGNOSIS — K5010 Crohn's disease of large intestine without complications: Secondary | ICD-10-CM | POA: Diagnosis not present

## 2021-07-29 DIAGNOSIS — K5090 Crohn's disease, unspecified, without complications: Secondary | ICD-10-CM | POA: Diagnosis not present

## 2021-07-29 DIAGNOSIS — K5010 Crohn's disease of large intestine without complications: Secondary | ICD-10-CM | POA: Diagnosis not present

## 2021-08-04 DIAGNOSIS — H2513 Age-related nuclear cataract, bilateral: Secondary | ICD-10-CM | POA: Diagnosis not present

## 2021-08-04 DIAGNOSIS — H5203 Hypermetropia, bilateral: Secondary | ICD-10-CM | POA: Diagnosis not present

## 2021-08-26 DIAGNOSIS — K5010 Crohn's disease of large intestine without complications: Secondary | ICD-10-CM | POA: Diagnosis not present

## 2021-09-23 DIAGNOSIS — K5010 Crohn's disease of large intestine without complications: Secondary | ICD-10-CM | POA: Diagnosis not present

## 2021-09-23 DIAGNOSIS — K5090 Crohn's disease, unspecified, without complications: Secondary | ICD-10-CM | POA: Diagnosis not present

## 2022-04-03 ENCOUNTER — Other Ambulatory Visit: Payer: Self-pay | Admitting: Gastroenterology

## 2022-05-13 DIAGNOSIS — L570 Actinic keratosis: Secondary | ICD-10-CM | POA: Diagnosis not present

## 2022-05-13 DIAGNOSIS — C44212 Basal cell carcinoma of skin of right ear and external auricular canal: Secondary | ICD-10-CM | POA: Diagnosis not present

## 2022-05-18 DIAGNOSIS — N2581 Secondary hyperparathyroidism of renal origin: Secondary | ICD-10-CM | POA: Diagnosis not present

## 2022-05-18 DIAGNOSIS — N183 Chronic kidney disease, stage 3 unspecified: Secondary | ICD-10-CM | POA: Diagnosis not present

## 2022-05-19 ENCOUNTER — Encounter (HOSPITAL_COMMUNITY): Payer: Self-pay | Admitting: Gastroenterology

## 2022-05-19 DIAGNOSIS — K5090 Crohn's disease, unspecified, without complications: Secondary | ICD-10-CM | POA: Diagnosis not present

## 2022-05-19 DIAGNOSIS — K5010 Crohn's disease of large intestine without complications: Secondary | ICD-10-CM | POA: Diagnosis not present

## 2022-05-26 ENCOUNTER — Ambulatory Visit (HOSPITAL_BASED_OUTPATIENT_CLINIC_OR_DEPARTMENT_OTHER): Payer: Medicare PPO | Admitting: Anesthesiology

## 2022-05-26 ENCOUNTER — Encounter (HOSPITAL_COMMUNITY): Payer: Self-pay | Admitting: Gastroenterology

## 2022-05-26 ENCOUNTER — Ambulatory Visit (HOSPITAL_COMMUNITY)
Admission: RE | Admit: 2022-05-26 | Discharge: 2022-05-26 | Disposition: A | Discharge status: Home / Self Care | Payer: Medicare PPO | Attending: Gastroenterology | Admitting: Gastroenterology

## 2022-05-26 ENCOUNTER — Ambulatory Visit (HOSPITAL_COMMUNITY): Payer: Medicare PPO | Admitting: Anesthesiology

## 2022-05-26 ENCOUNTER — Encounter (HOSPITAL_COMMUNITY)
Admission: RE | Disposition: A | Discharge status: Home / Self Care | Payer: Self-pay | Source: Home / Self Care | Attending: Gastroenterology

## 2022-05-26 DIAGNOSIS — K633 Ulcer of intestine: Secondary | ICD-10-CM | POA: Insufficient documentation

## 2022-05-26 DIAGNOSIS — K5010 Crohn's disease of large intestine without complications: Secondary | ICD-10-CM | POA: Insufficient documentation

## 2022-05-26 DIAGNOSIS — M1990 Unspecified osteoarthritis, unspecified site: Secondary | ICD-10-CM | POA: Insufficient documentation

## 2022-05-26 DIAGNOSIS — K529 Noninfective gastroenteritis and colitis, unspecified: Secondary | ICD-10-CM | POA: Diagnosis not present

## 2022-05-26 DIAGNOSIS — M8580 Other specified disorders of bone density and structure, unspecified site: Secondary | ICD-10-CM | POA: Diagnosis not present

## 2022-05-26 DIAGNOSIS — K6389 Other specified diseases of intestine: Secondary | ICD-10-CM | POA: Diagnosis not present

## 2022-05-26 DIAGNOSIS — K624 Stenosis of anus and rectum: Secondary | ICD-10-CM | POA: Insufficient documentation

## 2022-05-26 DIAGNOSIS — Z1211 Encounter for screening for malignant neoplasm of colon: Secondary | ICD-10-CM | POA: Insufficient documentation

## 2022-05-26 DIAGNOSIS — Z79899 Other long term (current) drug therapy: Secondary | ICD-10-CM | POA: Diagnosis not present

## 2022-05-26 DIAGNOSIS — K6289 Other specified diseases of anus and rectum: Secondary | ICD-10-CM

## 2022-05-26 DIAGNOSIS — K5090 Crohn's disease, unspecified, without complications: Secondary | ICD-10-CM | POA: Diagnosis not present

## 2022-05-26 DIAGNOSIS — K219 Gastro-esophageal reflux disease without esophagitis: Secondary | ICD-10-CM | POA: Diagnosis not present

## 2022-05-26 DIAGNOSIS — K5000 Crohn's disease of small intestine without complications: Secondary | ICD-10-CM | POA: Diagnosis not present

## 2022-05-26 DIAGNOSIS — K603 Anal fistula: Secondary | ICD-10-CM

## 2022-05-26 DIAGNOSIS — K50013 Crohn's disease of small intestine with fistula: Secondary | ICD-10-CM | POA: Insufficient documentation

## 2022-05-26 HISTORY — PX: COLONOSCOPY WITH PROPOFOL: SHX5780

## 2022-05-26 HISTORY — PX: BIOPSY: SHX5522

## 2022-05-26 SURGERY — COLONOSCOPY WITH PROPOFOL
Anesthesia: Monitor Anesthesia Care

## 2022-05-26 MED ORDER — LIDOCAINE 2% (20 MG/ML) 5 ML SYRINGE
INTRAMUSCULAR | Status: DC | PRN
  Administered 2022-05-26: 40 mg via INTRAVENOUS

## 2022-05-26 MED ORDER — SODIUM CHLORIDE 0.9 % IV SOLN: INTRAVENOUS | Status: DC

## 2022-05-26 MED ORDER — PROPOFOL 10 MG/ML IV BOLUS
INTRAVENOUS | Status: DC | PRN
  Administered 2022-05-26: 40 mg via INTRAVENOUS
  Administered 2022-05-26: 20 mg via INTRAVENOUS

## 2022-05-26 MED ORDER — PROPOFOL 500 MG/50ML IV EMUL
INTRAVENOUS | Status: DC | PRN
  Administered 2022-05-26: 125 ug/kg/min via INTRAVENOUS

## 2022-05-26 MED ORDER — LACTATED RINGERS IV SOLN
INTRAVENOUS | Status: AC | PRN
  Administered 2022-05-26: 1000 mL via INTRAVENOUS

## 2022-05-26 SURGICAL SUPPLY — 22 items

## 2022-05-26 NOTE — Anesthesia Procedure Notes (Signed)
Procedure Name: MAC Date/Time: 05/26/2022 8:58 AM  Performed by: Cynda Familia, CRNAPre-anesthesia Checklist: Patient identified, Emergency Drugs available, Suction available, Patient being monitored and Timeout performed Oxygen Delivery Method: Simple face mask Placement Confirmation: breath sounds checked- equal and bilateral and positive ETCO2 Dental Injury: Teeth and Oropharynx as per pre-operative assessment

## 2022-05-26 NOTE — Op Note (Signed)
[facility] Patient Name: Jeffrey Mejia Procedure Date: 05/26/2022 MRN: [id number] Attending MD: Lear Ng , MD, [id number] Date of Birth: Dec 16, 1945 CSN: [id number] Age: 77 Admit Type: Outpatient Procedure:                Colonoscopy Indications:              High risk colon cancer surveillance: Crohn's                            disease, Last colonoscopy: April 2013 Providers:                Lear Ng, MD, Jamison Neighbor RN, RN,                            [facility] Technician, Technician Referring MD:              Medicines:                Propofol per Anesthesia, Monitored Anesthesia Care Complications:            No immediate complications. Estimated Blood Loss:     Estimated blood loss was minimal. Procedure:                Pre-Anesthesia Assessment:                           - Prior to the procedure, a History and Physical                            was performed, and patient medications and                            allergies were reviewed. The patient's tolerance of                            previous anesthesia was also reviewed. The risks                            and benefits of the procedure and the sedation                            options and risks were discussed with the patient.                            All questions were answered, and informed consent                            was obtained. Prior Anticoagulants: The patient has                            taken no anticoagulant or antiplatelet agents. ASA                            Grade Assessment: II - A patient with mild systemic  disease. After reviewing the risks and benefits,                            the patient was deemed in satisfactory condition to                            undergo the procedure.                           After obtaining informed consent, the colonoscope                            was passed under direct vision. Throughout  the                            procedure, the patient's blood pressure, pulse, and                            oxygen saturations were monitored continuously. The                            [id number] ([id number]) Olympus enteroscope was                            introduced through the anus and advanced to the the                            cecum, identified by appendiceal orifice and                            ileocecal valve. The colonoscopy was extremely                            difficult due to significant looping and a tortuous                            colon. Successful completion of the procedure was                            aided by changing the patient to a supine position,                            straightening and shortening the scope to obtain                            bowel loop reduction and applying abdominal                            pressure. The patient tolerated the procedure well.                            The quality of the bowel preparation was fair. The  terminal ileum, ileocecal valve, appendiceal                            orifice, and rectum were photographed. Scope In: 9:06:25 AM Scope Out: 9:44:04 AM Scope Withdrawal Time: 0 hours 16 minutes 53 seconds  Total Procedure Duration: 0 hours 37 minutes 39 seconds  Findings:      The perianal exam findings include perianal fistula and anal canal       stenosis.      A diffuse area of mildly congested and erythematous mucosa was found in       the rectum and in the recto-sigmoid colon. Biopsies were taken with a       cold forceps for histology. Estimated blood loss was minimal.      The terminal ileum appeared normal. Biopsies were taken with a cold       forceps for histology. Estimated blood loss was minimal.      A patchy area of moderately congested, erythematous and ulcerated mucosa       was found in the cecum.      The exam was otherwise normal throughout the examined  colon. Impression:               - Preparation of the colon was fair.                           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                           - Congested and erythematous mucosa in the rectum                            and in the recto-sigmoid colon. Biopsied.                           - The examined portion of the ileum was normal.                            Biopsied.                           - Congested, erythematous and ulcerated mucosa in                            the cecum. Moderate Sedation:      N/A - MAC procedure Recommendation:           - Patient has a contact number available for                            emergencies. The signs and symptoms of potential                            delayed complications were discussed with the  patient. Return to normal activities tomorrow.                            Written discharge instructions were provided to the                            patient.                           - Resume previous diet.                           - Await pathology results.                           - Repeat colonoscopy for surveillance based on                            pathology results. Procedure Code(s):        --- Professional ---                           [phone], Colonoscopy, flexible; with biopsy, single                            or multiple Diagnosis Code(s):        --- Professional ---                           K50.90, Crohn's disease, unspecified, without                            complications                           K63.89, Other specified diseases of intestine                           K62.89, Other specified diseases of anus and rectum                           K63.3, Ulcer of intestine                           K62.4, Stenosis of anus and rectum                           K60.3, Anal fistula CPT copyright 2022 American Medical Association.  All rights reserved. The codes documented in this report are preliminary and upon coder review may  be revised to meet current compliance requirements. Lear Ng, MD 05/26/2022 9:55:35 AM This report has been signed electronically. Number of Addenda: 0

## 2022-05-26 NOTE — Transfer of Care (Signed)
Immediate Anesthesia Transfer of Care Note  Patient: Jeffrey Mejia  Procedure(s) Performed: COLONOSCOPY WITH PROPOFOL BIOPSY  Patient Location: PACU and Endoscopy Unit  Anesthesia Type:MAC  Level of Consciousness: awake  Airway & Oxygen Therapy: Patient Spontanous Breathing and Patient connected to face mask oxygen  Post-op Assessment: Report given to RN and Post -op Vital signs reviewed and stable  Post vital signs: Reviewed and stable  Last Vitals:  Vitals Value Taken Time  BP    Temp    Pulse    Resp    SpO2      Last Pain:  Vitals:   05/26/22 0805  TempSrc: Temporal  PainSc: 0-No pain         Complications: No notable events documented.

## 2022-05-26 NOTE — H&P (Signed)
Date of Initial H&P: 05/14/22  History reviewed, patient examined, no change in status, stable for surgery.

## 2022-05-26 NOTE — Discharge Instructions (Signed)
Office will call when pathology results are complete.

## 2022-05-26 NOTE — Anesthesia Preprocedure Evaluation (Signed)
Anesthesia Evaluation  Patient identified by MRN, date of birth, ID band Patient awake    Reviewed: Allergy & Precautions, NPO status , Patient's Chart, lab work & pertinent test results, reviewed documented beta blocker date and time   Airway Mallampati: II  TM Distance: >3 FB Neck ROM: Full    Dental no notable dental hx. (+) Dental Advisory Given, Caps   Pulmonary neg pulmonary ROS   Pulmonary exam normal breath sounds clear to auscultation       Cardiovascular negative cardio ROS Normal cardiovascular exam Rhythm:Regular Rate:Normal     Neuro/Psych negative neurological ROS  negative psych ROS   GI/Hepatic Neg liver ROS,GERD  Medicated and Controlled,,Hx/o Esophageal stricture Hx/o Crohn's ileitis   Endo/Other  negative endocrine ROS    Renal/GU negative Renal ROS  negative genitourinary   Musculoskeletal  (+) Arthritis , Osteoarthritis,  Hx/o osteopenia   Abdominal   Peds  Hematology  (+) Blood dyscrasia, anemia   Anesthesia Other Findings   Reproductive/Obstetrics                              Anesthesia Physical Anesthesia Plan  ASA: 2  Anesthesia Plan: MAC   Post-op Pain Management: Minimal or no pain anticipated   Induction: Intravenous  PONV Risk Score and Plan: 2 and Treatment may vary due to age or medical condition and Propofol infusion  Airway Management Planned: Natural Airway and Nasal Cannula  Additional Equipment: None  Intra-op Plan:   Post-operative Plan:   Informed Consent: I have reviewed the patients History and Physical, chart, labs and discussed the procedure including the risks, benefits and alternatives for the proposed anesthesia with the patient or authorized representative who has indicated his/her understanding and acceptance.     Dental advisory given  Plan Discussed with: CRNA and Anesthesiologist  Anesthesia Plan Comments:           Anesthesia Quick Evaluation

## 2022-05-26 NOTE — Interval H&P Note (Signed)
History and Physical Interval Note:  05/26/2022 8:52 AM  Jeffrey Mejia  has presented today for surgery, with the diagnosis of Crohn's disease.  The various methods of treatment have been discussed with the patient and family. After consideration of risks, benefits and other options for treatment, the patient has consented to  Procedure(s) with comments: COLONOSCOPY WITH PROPOFOL (N/A) - Needs regular scope but also may need peds scope as a surgical intervention.  The patient's history has been reviewed, patient examined, no change in status, stable for surgery.  I have reviewed the patient's chart and labs.  Questions were answered to the patient's satisfaction.     Lear Ng

## 2022-05-26 NOTE — Anesthesia Postprocedure Evaluation (Signed)
Anesthesia Post Note  Patient: Jeffrey Mejia  Procedure(s) Performed: COLONOSCOPY WITH PROPOFOL BIOPSY     Patient location during evaluation: PACU Anesthesia Type: MAC Level of consciousness: awake and alert and oriented Pain management: pain level controlled Vital Signs Assessment: post-procedure vital signs reviewed and stable Respiratory status: spontaneous breathing, nonlabored ventilation and respiratory function stable Cardiovascular status: stable and blood pressure returned to baseline Postop Assessment: no apparent nausea or vomiting Anesthetic complications: no   No notable events documented.  Last Vitals:  Vitals:   05/26/22 0950 05/26/22 0959  BP: 106/62 118/67  Pulse: 74 80  Resp: (!) 21 18  Temp:  (!) 35.9 C  SpO2:      Last Pain:  Vitals:   05/26/22 0959  TempSrc: Temporal  PainSc: 0-No pain                 Ordean Fouts A.

## 2022-05-27 ENCOUNTER — Encounter (HOSPITAL_COMMUNITY): Payer: Self-pay | Admitting: Gastroenterology

## 2022-05-27 LAB — SURGICAL PATHOLOGY

## 2022-06-03 DIAGNOSIS — D631 Anemia in chronic kidney disease: Secondary | ICD-10-CM | POA: Diagnosis not present

## 2022-06-03 DIAGNOSIS — N183 Chronic kidney disease, stage 3 unspecified: Secondary | ICD-10-CM | POA: Diagnosis not present

## 2022-06-03 DIAGNOSIS — N2581 Secondary hyperparathyroidism of renal origin: Secondary | ICD-10-CM | POA: Diagnosis not present

## 2022-06-08 DIAGNOSIS — Z6822 Body mass index (BMI) 22.0-22.9, adult: Secondary | ICD-10-CM | POA: Diagnosis not present

## 2022-06-08 DIAGNOSIS — Z0000 Encounter for general adult medical examination without abnormal findings: Secondary | ICD-10-CM | POA: Diagnosis not present

## 2022-06-10 DIAGNOSIS — Z08 Encounter for follow-up examination after completed treatment for malignant neoplasm: Secondary | ICD-10-CM | POA: Diagnosis not present

## 2022-06-10 DIAGNOSIS — L821 Other seborrheic keratosis: Secondary | ICD-10-CM | POA: Diagnosis not present

## 2022-06-10 DIAGNOSIS — D225 Melanocytic nevi of trunk: Secondary | ICD-10-CM | POA: Diagnosis not present

## 2022-06-10 DIAGNOSIS — L814 Other melanin hyperpigmentation: Secondary | ICD-10-CM | POA: Diagnosis not present

## 2022-06-10 DIAGNOSIS — L570 Actinic keratosis: Secondary | ICD-10-CM | POA: Diagnosis not present

## 2022-06-10 DIAGNOSIS — Z85828 Personal history of other malignant neoplasm of skin: Secondary | ICD-10-CM | POA: Diagnosis not present

## 2022-06-10 DIAGNOSIS — Z85820 Personal history of malignant melanoma of skin: Secondary | ICD-10-CM | POA: Diagnosis not present

## 2022-06-16 DIAGNOSIS — K5010 Crohn's disease of large intestine without complications: Secondary | ICD-10-CM | POA: Diagnosis not present

## 2022-06-16 DIAGNOSIS — K5090 Crohn's disease, unspecified, without complications: Secondary | ICD-10-CM | POA: Diagnosis not present

## 2022-06-17 DIAGNOSIS — L570 Actinic keratosis: Secondary | ICD-10-CM | POA: Diagnosis not present

## 2022-06-17 DIAGNOSIS — Z48817 Encounter for surgical aftercare following surgery on the skin and subcutaneous tissue: Secondary | ICD-10-CM | POA: Diagnosis not present

## 2022-06-23 DIAGNOSIS — M8589 Other specified disorders of bone density and structure, multiple sites: Secondary | ICD-10-CM | POA: Diagnosis not present

## 2022-06-23 DIAGNOSIS — Z201 Contact with and (suspected) exposure to tuberculosis: Secondary | ICD-10-CM | POA: Diagnosis not present

## 2022-06-26 ENCOUNTER — Other Ambulatory Visit: Payer: Self-pay | Admitting: Family Medicine

## 2022-06-26 DIAGNOSIS — R7612 Nonspecific reaction to cell mediated immunity measurement of gamma interferon antigen response without active tuberculosis: Secondary | ICD-10-CM | POA: Diagnosis not present

## 2022-06-26 DIAGNOSIS — J984 Other disorders of lung: Secondary | ICD-10-CM

## 2022-06-29 ENCOUNTER — Ambulatory Visit
Admission: RE | Admit: 2022-06-29 | Discharge: 2022-06-29 | Disposition: A | Discharge status: Home / Self Care | Payer: Medicare PPO | Source: Ambulatory Visit | Attending: Family Medicine | Admitting: Family Medicine

## 2022-06-29 DIAGNOSIS — J984 Other disorders of lung: Secondary | ICD-10-CM

## 2022-06-29 DIAGNOSIS — Q676 Pectus excavatum: Secondary | ICD-10-CM | POA: Diagnosis not present

## 2022-06-30 ENCOUNTER — Other Ambulatory Visit (HOSPITAL_COMMUNITY): Payer: Self-pay

## 2022-06-30 ENCOUNTER — Encounter: Payer: Self-pay | Admitting: Infectious Diseases

## 2022-06-30 ENCOUNTER — Ambulatory Visit: Payer: Medicare PPO | Admitting: Infectious Diseases

## 2022-06-30 ENCOUNTER — Other Ambulatory Visit: Payer: Self-pay

## 2022-06-30 VITALS — BP 116/77 | HR 72 | Temp 97.5°F | Resp 16 | Ht 70.0 in | Wt 150.8 lb

## 2022-06-30 DIAGNOSIS — Z114 Encounter for screening for human immunodeficiency virus [HIV]: Secondary | ICD-10-CM | POA: Diagnosis not present

## 2022-06-30 DIAGNOSIS — R7612 Nonspecific reaction to cell mediated immunity measurement of gamma interferon antigen response without active tuberculosis: Secondary | ICD-10-CM | POA: Diagnosis not present

## 2022-06-30 DIAGNOSIS — K222 Esophageal obstruction: Secondary | ICD-10-CM | POA: Diagnosis not present

## 2022-06-30 DIAGNOSIS — L570 Actinic keratosis: Secondary | ICD-10-CM | POA: Diagnosis not present

## 2022-06-30 DIAGNOSIS — Z23 Encounter for immunization: Secondary | ICD-10-CM | POA: Diagnosis not present

## 2022-06-30 NOTE — Progress Notes (Unsigned)
[facility]                                      [address], [place], [place], [address]                                               Phn. [phone]; Fax: [phone]                                                               Date: 06/30/22  Reason for Visit: Positive Quantiferon  Requesting Provider: Lahoma Rocker   HPI: Jeffrey Mejia is a 77 y.o.old male with Crohn's disease s/p SB resection 1993, CKD who is referred for positive quantiferon.   Had crohn's disease since teenager and on Remicade every 4 weeks for last 22 years and feels crohn's disease is stable. He had quantiferon approx every year while on Remicade and reports being negative until now.   Denies cough more than 2 weeks, night sweats, SOB, hemoptysis, loss of appetite and loss of weight.  Risk factor for reactivation: Born in [place], denies travel to [place] or african countries. Denies h/o IVDU, being homeless, incarcerated or living in a congregate settings. Denies h/o close contact with TB but  reports his wife's father having TB and wife's mother always positive for TB test. He has friends who are refugees and homeless.  Lives with wife and monogamous.  Denies smoking, alcohol occasionally Used to work at [employer] at [employer] but retired now.  He has no complaints today  05/09/2021 negative 06/16/22 Quantiferon TB Gold plus positive  CBC w diff and CMP unremarkable Cr 1.37 HCV ab NR Hep B core abs  total NR, Hep B surface abs negative, hepatitis B surface ag NR  ROS: Denies yellowish discoloration of sclera and skin, abdominal pain/distension, hematemesis.            Denies fever, chills, nightsweats, nausea, vomiting, diarrhea, constipation, weight loss, recent hospitalizations, rashes, joint complaints, shortness of breath, cough, chest pain, headaches, GU complaints.   Current Outpatient Medications on File  Prior to Visit  Medication Sig Dispense Refill   bifidobacterium infantis (ALIGN) capsule Take 1 capsule by mouth daily.     Cholecalciferol (VITAMIN D) 125 MCG (5000 UT) CAPS Take 5,000 Units by mouth daily.     clotrimazole (MYCELEX) 10 MG troche Take 10 mg by mouth 2 (two) times daily.      Cyanocobalamin 1000 MCG/ML LIQD Inject 1,000 mcg into the skin every 28 (twenty-eight) days.     Ferrous Sulfate Dried ([facility] SLOW RELEASE IRON) 45 MG TBCR Take 45 mg by mouth daily.     fexofenadine (ALLEGRA) 180 MG tablet Take 180 mg by mouth daily.     hydrocortisone (PROCTOSOL HC) 2.5 % rectal cream Apply rectally twice a day for 30 days     magnesium oxide (MAG-OX) 400 (240 Mg) MG tablet Take 400 mg by mouth daily.     metroNIDAZOLE (METROGEL) 0.75 % gel Apply 1 Application topically daily. Nose  and face for rosacea     mometasone (ELOCON) 0.1 % cream Apply 1 Application topically daily as needed (eczema).     Multiple Vitamin (MULTIVITAMIN WITH MINERALS) TABS tablet Take 1 tablet by mouth daily.     pantoprazole (PROTONIX) 40 MG tablet Take 40 mg by mouth daily.     sodium chloride 0.9 % SOLN with inFLIXimab 100 MG SOLR Inject into the vein every 28 (twenty-eight) days. 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     No current facility-administered medications on file prior to visit.   No Known Allergies  Past Medical History:  Diagnosis Date   Anemia    Arthritis    Crohn's ileitis ([place])    Esophageal stricture    GERD (gastroesophageal reflux disease)    History of stomach ulcers    Osteopenia    on no current medication   Rosacea    Past Surgical History:  Procedure Laterality Date   APPENDECTOMY     BALLOON DILATION     x1 before   BALLOON DILATION  08/11/2011   Procedure: BALLOON DILATION;  Surgeon: Cleotis Nipper, MD;  Location: [facility];  Service: Endoscopy;  Laterality: N/A;   BIOPSY   05/26/2022   Procedure: BIOPSY;  Surgeon: Wilford Corner, MD;  Location: [facility];  Service: Gastroenterology;;   COLON SURGERY     COLONOSCOPY  08/11/2011   Procedure: COLONOSCOPY;  Surgeon: Cleotis Nipper, MD;  Location: [facility];  Service: Endoscopy;  Laterality: N/A;   COLONOSCOPY WITH PROPOFOL N/A 05/26/2022   Procedure: COLONOSCOPY WITH PROPOFOL;  Surgeon: Wilford Corner, MD;  Location: [facility];  Service: Gastroenterology;  Laterality: N/A;  Needs regular scope but also may need peds scope   ESOPHAGOGASTRODUODENOSCOPY  08/11/2011   Procedure: ESOPHAGOGASTRODUODENOSCOPY (EGD);  Surgeon: Cleotis Nipper, MD;  Location: [facility];  Service: Endoscopy;  Laterality: N/A;   ESOPHAGOGASTRODUODENOSCOPY (EGD) WITH PROPOFOL N/A 12/09/2018   Procedure: ESOPHAGOGASTRODUODENOSCOPY (EGD) WITH PROPOFOL;  Surgeon: Clarene Essex, MD;  Location: [facility];  Service: Endoscopy;  Laterality: N/A;   FOREIGN BODY REMOVAL  12/09/2018   Procedure: FOREIGN BODY REMOVAL;  Surgeon: Clarene Essex, MD;  Location: [facility];  Service: Endoscopy;;   KNEE ARTHROSCOPY WITH MEDIAL MENISECTOMY Right 05/30/2015   Procedure: RIGHT KNEE ARTHROSCOPY CHONDROPLASTY, MEDIAL MENISECTOMY;  Surgeon: Ninetta Lights, MD;  Location: [place];  Service: Orthopedics;  Laterality: Right;   melanoma removal     from back, age 41   [place] History   Socioeconomic History   Marital status: Married    Spouse name: Not on file   Number of children: Not on file   Years of education: Not on file   Highest education level: Not on file  Occupational History   Not on file  Tobacco Use   Smoking status: Never   Smokeless tobacco: Never  Substance and Sexual Activity   Alcohol use: Yes    Alcohol/week: 2.0 standard drinks of alcohol    Types: 2 Glasses of wine per week    Comment: couple glasses wine per week   Drug use: No   Sexual activity: Not on  file  Other Topics Concern   Not on file  Social History Narrative   Not on file   Social Determinants of Health   Financial Resource Strain: Not on file  Food Insecurity: Not on file  Transportation Needs: Not on file  Physical Activity: Not on file  Stress: Not on file  Social Connections: Not on file  Intimate Partner Violence: Not on file   Family History  Problem Relation Age of Onset   Anesthesia problems Neg Hx    Hypotension Neg Hx    Malignant hyperthermia Neg Hx    Pseudochol deficiency Neg Hx    Vitals  BP 116/77   Pulse 72   Temp (!) 97.5 F (36.4 C) (Oral)   Resp 16   Ht '5\' 10"'$  (1.778 m)   Wt 150 lb 12.8 oz (68.4 kg)   SpO2 100%   BMI 21.64 kg/m    Gen: Alert and oriented x 3, no acute distress HEENT: [place]/AT, no scleral icterus, no pale conjunctivae, hearing normal, oral mucosa moist Neck: Supple Cardio: Regular rate and rhythm; +S1 and S2 Resp: CTAB GI: Soft, nontender, nondistended,  GU: Musc: Extremities: No pedal edema Skin: No obvious rashes Neuro: grossly non focal, awake, alert and oriented  Psych: Calm, cooperative   Laboratory     Latest Ref Rng & Units 12/09/2018    3:55 PM  CBC  WBC 4.0 - 10.5 K/uL 7.3   Hemoglobin 13.0 - 17.0 g/dL 13.1   Hematocrit 39.0 - 52.0 % 41.3   Platelets 150 - 400 K/uL 204       Latest Ref Rng & Units 12/09/2018    3:55 PM 01/02/2015   11:35 AM  CMP  Glucose 70 - 99 mg/dL 86  95   BUN 8 - 23 mg/dL 14  17   Creatinine 0.61 - 1.24 mg/dL 1.60  1.40   Sodium 135 - 145 mmol/L 140  138   Potassium 3.5 - 5.1 mmol/L 3.3  4.7   Chloride 98 - 111 mmol/L 105  102   CO2 22 - 32 mmol/L 24  27   Calcium 8.9 - 10.3 mg/dL 8.8  9.4   Total Protein 6.1 - 8.1 g/dL  7.3   Total Bilirubin 0.2 - 1.2 mg/dL  0.6   Alkaline Phos 40 - 115 U/L  78   AST 10 - 35 U/L  26   ALT 9 - 46 U/L  10     Pertinent Imaging CT CHEST WO CONTRAST  Result Date: 06/30/2022 CLINICAL DATA:  Lung scarring on radiograph. EXAM: CT CHEST  WITHOUT CONTRAST TECHNIQUE: Multidetector CT imaging of the chest was performed following the standard protocol without IV contrast. RADIATION DOSE REDUCTION: This exam was performed according to the departmental dose-optimization program which includes automated exposure control, adjustment of the mA and/or kV according to patient size and/or use of iterative reconstruction technique. COMPARISON:  None Available. FINDINGS: Cardiovasc No significant vascular findings. Normal heart size. No pericardial effusion. Mediastinum/Nodes: No axillary or supraclavicular adenopathy. No mediastinal or hilar adenopathy. No pericardial fluid. Esophagus normal. Lungs/Pleura: Mild thickening along superior aspect of the RIGHT oblique fissure. No measurable nodularity. No suspicious pulmonary nodules within LEFT or RIGHT lung. Upper Abdomen: Limited view of the liver, kidneys, pancreas are unremarkable. Normal adrenal glands. Large gallstone noted. 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 IMPRESSION: 1. No suspicious pulmonary nodule. Mild thickening along the superior RIGHT oblique fissure. 2. No evidence of pulmonary infection. 3. 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 Electronically Signed   By: Suzy Bouchard M.D.   On: 06/30/2022 10:33    Assessment/Plan: # Positive Quantiferon/latent TB Discussed about Quantiferon test, latent TB and  active TB at length There is a probability it could be a false positive test given his prior quantiferon have always been negative and no direct risk factors. However, given his immunosuppressive state, would favor to treat.  Discussed about different treatment options for treatment of latent TB including its side effects  Requested chest xray done at PCP, fu CT Chest results ( pending currently) HIV ab Fu to be made pending above for tx  # HIV screening - HIV ab as above  # Esophageal stricture - will consider smaller size pills for tx  #  Immunization  - non immune to Hep B and will benefit from Hep B vaccination  I have personally spent 65  minutes involved in face-to-face and non-face-to-face activities for this patient on the day of the visit. Professional time spent includes the following activities: Preparing to see the patient (review of tests), Obtaining and/or reviewing separately obtained history (admission/discharge record), Performing a medically appropriate examination and/or evaluation , Ordering medications/tests/procedures, referring and communicating with other health care professionals, Documenting clinical information in the EMR, Independently interpreting results (not separately reported), Communicating results to the patient/family/caregiver, Counseling and educating the patient/family/caregiver and Care coordination (not separately reported).    Patients questions were addressed and answered.    Electronically signed by:  Rosiland Oz, MD Infectious Diseases  Office phone [phone] Fax no. [phone]

## 2022-07-01 ENCOUNTER — Telehealth: Payer: Self-pay

## 2022-07-01 DIAGNOSIS — Z23 Encounter for immunization: Secondary | ICD-10-CM | POA: Insufficient documentation

## 2022-07-01 DIAGNOSIS — K222 Esophageal obstruction: Secondary | ICD-10-CM | POA: Insufficient documentation

## 2022-07-01 LAB — HIV ANTIBODY (ROUTINE TESTING W REFLEX): HIV 1&2 Ab, 4th Generation: NONREACTIVE

## 2022-07-01 NOTE — Telephone Encounter (Signed)
-----   Message from Rosiland Oz, MD sent at 07/01/2022  6:01 AM EST ----- Regarding: Fu CT chest Please let him know that CT chest is not concerning for TB.

## 2022-07-01 NOTE — Telephone Encounter (Signed)
Patient left voicemail wanting to know when he would start treatment for latent TB. Will route to provider.   Beryle Flock, RN

## 2022-07-01 NOTE — Telephone Encounter (Signed)
I am waiting for his HIV test to come back. Will plan for treatment once it is back. Should come back soon.

## 2022-07-02 ENCOUNTER — Other Ambulatory Visit (HOSPITAL_COMMUNITY): Payer: Self-pay

## 2022-07-03 ENCOUNTER — Other Ambulatory Visit (HOSPITAL_COMMUNITY): Payer: Self-pay

## 2022-07-03 ENCOUNTER — Telehealth: Payer: Self-pay | Admitting: Pharmacist

## 2022-07-03 DIAGNOSIS — R7612 Nonspecific reaction to cell mediated immunity measurement of gamma interferon antigen response without active tuberculosis: Secondary | ICD-10-CM

## 2022-07-03 MED ORDER — ISONIAZID 300 MG PO TABS
900.0000 mg | ORAL_TABLET | ORAL | 0 refills | Status: DC
  Filled 2022-07-03: qty 36, 84d supply, fill #0

## 2022-07-03 MED ORDER — VITAMIN B-6 50 MG PO TABS
50.0000 mg | ORAL_TABLET | ORAL | 0 refills | Status: DC
  Filled 2022-07-03: qty 100, fill #0

## 2022-07-03 MED ORDER — RIFAPENTINE 150 MG PO TABS
900.0000 mg | ORAL_TABLET | ORAL | 0 refills | Status: DC
  Filled 2022-07-03: qty 72, 84d supply, fill #0

## 2022-07-03 NOTE — Telephone Encounter (Signed)
Spoke with patient on the phone about his latent TB treatment plan. Filling rifapentine '900mg'$ , isoniazid '900mg'$ , and pyridoxine '50mg'$  all once weekly at [facility]. He will receive a partial fill of rifapentine today and complete his fill next week when the pharmacy can order more. Otherwise, he will pick up all his medicines today and start treatment tonight. Total cost for his full treatment is ~$120.  Reviewed to take 6 tablets of weekly rifapentine with food and his 3 tablets of weekly isoniazid on an empty stomach. Discussed that he does not have to take these on the same day of the week. He verbalized understanding. Only question he asked was about absorption issues with vitamin B6 as he previously underwent gastric surgery. Reviewed that although his vitamin B6 absorption may be decreased slightly, we will still start him on it while taking isoniazid to prevent peripheral neuropathy. Also educated him on orange-colored secretions with rifapentine. Discussed that all of these medications can be crushed if needed for him.   Alfonse Spruce, PharmD, CPP, BCIDP, [place] Clinical Pharmacist Practitioner [facility] [place] [facility]

## 2022-07-04 ENCOUNTER — Other Ambulatory Visit (HOSPITAL_COMMUNITY): Payer: Self-pay

## 2022-07-06 ENCOUNTER — Other Ambulatory Visit (HOSPITAL_COMMUNITY): Payer: Self-pay

## 2022-07-06 ENCOUNTER — Other Ambulatory Visit: Payer: Self-pay

## 2022-07-06 DIAGNOSIS — N183 Chronic kidney disease, stage 3 unspecified: Secondary | ICD-10-CM | POA: Diagnosis not present

## 2022-07-06 DIAGNOSIS — N1831 Chronic kidney disease, stage 3a: Secondary | ICD-10-CM | POA: Diagnosis not present

## 2022-07-06 DIAGNOSIS — K5090 Crohn's disease, unspecified, without complications: Secondary | ICD-10-CM | POA: Diagnosis not present

## 2022-07-06 DIAGNOSIS — D84821 Immunodeficiency due to drugs: Secondary | ICD-10-CM | POA: Diagnosis not present

## 2022-07-06 NOTE — Telephone Encounter (Signed)
Thank you Amanda!

## 2022-07-07 ENCOUNTER — Ambulatory Visit: Payer: Medicare PPO | Admitting: Internal Medicine

## 2022-07-13 ENCOUNTER — Telehealth: Payer: Self-pay | Admitting: Internal Medicine

## 2022-07-13 NOTE — Telephone Encounter (Signed)
Jeffrey Mejia called to schedule a follow-up appointment but requested Dr. Linus Salmons. He wanted it known there were absolutely no issues with Dr. [place] Bali and he enjoyed her care. At the time of the referral, he requested Dr. Linus Salmons but scheduled with [facility] based on availability. He is scheduled April 15th.

## 2022-07-17 ENCOUNTER — Other Ambulatory Visit: Payer: Self-pay

## 2022-07-17 ENCOUNTER — Ambulatory Visit: Payer: Medicare PPO | Admitting: Internal Medicine

## 2022-07-17 ENCOUNTER — Encounter: Payer: Self-pay | Admitting: Internal Medicine

## 2022-07-17 VITALS — BP 111/74 | HR 101 | Resp 16 | Ht 70.0 in | Wt 148.0 lb

## 2022-07-17 DIAGNOSIS — Z5181 Encounter for therapeutic drug level monitoring: Secondary | ICD-10-CM | POA: Diagnosis not present

## 2022-07-17 DIAGNOSIS — K50013 Crohn's disease of small intestine with fistula: Secondary | ICD-10-CM | POA: Diagnosis not present

## 2022-07-17 DIAGNOSIS — Z227 Latent tuberculosis: Secondary | ICD-10-CM | POA: Diagnosis not present

## 2022-07-17 NOTE — Progress Notes (Signed)
   Subjective:    Patient ID: Jeffrey Mejia, male    DOB: 1946-04-02, 77 y.o.   MRN: [id number]  HPI Mr Jeffrey Mejia is here for follow up of latent Tb He was started on rifapentin and inh weekly with B6 and doing well.  Plan for 12 weeks.     Review of Systems  Constitutional:  Negative for fatigue and fever.  Gastrointestinal:  Negative for diarrhea.  Skin:  Negative for rash.       Objective:   Physical Exam Eyes:     General: No scleral icterus. Pulmonary:     Effort: Pulmonary effort is normal.  Skin:    Findings: No rash.  Neurological:     Mental Status: He is alert.   SH: no tobacco        Assessment & Plan:

## 2022-07-17 NOTE — Assessment & Plan Note (Signed)
Will check his hepatic function panel and repeat next visit

## 2022-07-17 NOTE — Assessment & Plan Note (Signed)
Doing well on treatment after the initial dose and tolerated the second one well.   Will continue for 12 weeks Follow up in 1 month

## 2022-07-17 NOTE — Assessment & Plan Note (Signed)
Ok for [employer] after latent tb treatment completion.

## 2022-07-18 LAB — HEPATIC FUNCTION PANEL
AG Ratio: 1.4 (calc) (ref 1.0–2.5)
ALT: 19 U/L (ref 9–46)
AST: 38 U/L — ABNORMAL HIGH (ref 10–35)
Albumin: 4 g/dL (ref 3.6–5.1)
Alkaline phosphatase (APISO): 90 U/L (ref 35–144)
Bilirubin, Direct: 0.2 mg/dL (ref 0.0–0.2)
Globulin: 2.9 g/dL (calc) (ref 1.9–3.7)
Indirect Bilirubin: 0.4 mg/dL (calc) (ref 0.2–1.2)
Total Bilirubin: 0.6 mg/dL (ref 0.2–1.2)
Total Protein: 6.9 g/dL (ref 6.1–8.1)

## 2022-07-18 LAB — CBC WITH DIFFERENTIAL/PLATELET
Absolute Monocytes: 524 cells/uL (ref 200–950)
Basophils Absolute: 29 cells/uL (ref 0–200)
Basophils Relative: 0.5 %
Eosinophils Absolute: 182 cells/uL (ref 15–500)
Eosinophils Relative: 3.2 %
HCT: 38.8 % (ref 38.5–50.0)
Hemoglobin: 12.7 g/dL — ABNORMAL LOW (ref 13.2–17.1)
Lymphs Abs: 1414 cells/uL (ref 850–3900)
MCH: 30.8 pg (ref 27.0–33.0)
MCHC: 32.7 g/dL (ref 32.0–36.0)
MCV: 93.9 fL (ref 80.0–100.0)
MPV: 11.4 fL (ref 7.5–12.5)
Monocytes Relative: 9.2 %
Neutro Abs: 3551 cells/uL (ref 1500–7800)
Neutrophils Relative %: 62.3 %
Platelets: 230 10*3/uL (ref 140–400)
RBC: 4.13 10*6/uL — ABNORMAL LOW (ref 4.20–5.80)
RDW: 12.6 % (ref 11.0–15.0)
Total Lymphocyte: 24.8 %
WBC: 5.7 10*3/uL (ref 3.8–10.8)

## 2022-07-22 DIAGNOSIS — E538 Deficiency of other specified B group vitamins: Secondary | ICD-10-CM | POA: Diagnosis not present

## 2022-08-04 DIAGNOSIS — K5090 Crohn's disease, unspecified, without complications: Secondary | ICD-10-CM | POA: Diagnosis not present

## 2022-08-04 DIAGNOSIS — K5010 Crohn's disease of large intestine without complications: Secondary | ICD-10-CM | POA: Diagnosis not present

## 2022-08-11 ENCOUNTER — Other Ambulatory Visit: Payer: Self-pay

## 2022-08-11 ENCOUNTER — Ambulatory Visit: Payer: Medicare PPO | Admitting: Internal Medicine

## 2022-08-11 ENCOUNTER — Encounter: Payer: Self-pay | Admitting: Internal Medicine

## 2022-08-11 VITALS — BP 95/59 | HR 78 | Temp 98.0°F | Resp 18 | Ht 70.0 in | Wt 149.7 lb

## 2022-08-11 DIAGNOSIS — K50013 Crohn's disease of small intestine with fistula: Secondary | ICD-10-CM | POA: Diagnosis not present

## 2022-08-11 DIAGNOSIS — Z227 Latent tuberculosis: Secondary | ICD-10-CM

## 2022-08-11 DIAGNOSIS — Z5181 Encounter for therapeutic drug level monitoring: Secondary | ICD-10-CM | POA: Diagnosis not present

## 2022-08-11 NOTE — Assessment & Plan Note (Signed)
Will check his hepatic function again

## 2022-08-11 NOTE — Assessment & Plan Note (Signed)
Tolerating well, no issues. Now com[pleted 6 weeks.  Rtc in 1 month for recheck.

## 2022-08-11 NOTE — Assessment & Plan Note (Signed)
Restarted [employer] and feels better.

## 2022-08-11 NOTE — Progress Notes (Signed)
   Subjective:    Patient ID: Jeffrey Mejia, male    DOB: 03/26/1946, 76 y.o.   MRN: [id number]  HPI Jeffrey Mejia is here for follow up of latent Tb He was started on rifapentin and inh weekly with B6 and doing well.  Plan for 12 weeks.     Review of Systems  Constitutional:  Negative for fatigue and fever.  Gastrointestinal:  Negative for diarrhea.  Skin:  Negative for rash.       Objective:   Physical Exam Eyes:     General: No scleral icterus. Pulmonary:     Effort: Pulmonary effort is normal.  Skin:    Findings: No rash.  Neurological:     Mental Status: He is alert.   SH: no tobacco        Assessment & Plan:    

## 2022-08-12 LAB — HEPATIC FUNCTION PANEL
AG Ratio: 1.2 (calc) (ref 1.0–2.5)
ALT: 16 U/L (ref 9–46)
AST: 34 U/L (ref 10–35)
Albumin: 3.6 g/dL (ref 3.6–5.1)
Alkaline phosphatase (APISO): 103 U/L (ref 35–144)
Bilirubin, Direct: 0.1 mg/dL (ref 0.0–0.2)
Globulin: 2.9 g/dL (calc) (ref 1.9–3.7)
Indirect Bilirubin: 0.3 mg/dL (calc) (ref 0.2–1.2)
Total Bilirubin: 0.4 mg/dL (ref 0.2–1.2)
Total Protein: 6.5 g/dL (ref 6.1–8.1)

## 2022-09-01 DIAGNOSIS — K5010 Crohn's disease of large intestine without complications: Secondary | ICD-10-CM | POA: Diagnosis not present

## 2022-09-01 DIAGNOSIS — K5090 Crohn's disease, unspecified, without complications: Secondary | ICD-10-CM | POA: Diagnosis not present

## 2022-09-16 ENCOUNTER — Other Ambulatory Visit: Payer: Self-pay

## 2022-09-16 ENCOUNTER — Encounter: Payer: Self-pay | Admitting: Internal Medicine

## 2022-09-16 ENCOUNTER — Ambulatory Visit: Payer: Medicare PPO | Admitting: Internal Medicine

## 2022-09-16 VITALS — BP 109/70 | HR 84 | Temp 98.1°F | Wt 147.0 lb

## 2022-09-16 DIAGNOSIS — R7612 Nonspecific reaction to cell mediated immunity measurement of gamma interferon antigen response without active tuberculosis: Secondary | ICD-10-CM

## 2022-09-16 DIAGNOSIS — Z227 Latent tuberculosis: Secondary | ICD-10-CM | POA: Diagnosis not present

## 2022-09-16 NOTE — Progress Notes (Signed)
   Subjective:    Patient ID: Jeffrey Mejia, male    DOB: October 26, 1945, 77 y.o.   MRN: [id number]  HPI Jeffrey Mejia is here for follow up of latent Tb.  He was started on rifapentin and inh weekly with B6 and doing well.  Has now completed 11 doses and due for one more.  No cough.  No rash or diarrhea.    Review of Systems  Constitutional:  Negative for fatigue.  Respiratory:  Negative for cough.   Gastrointestinal:  Negative for diarrhea.  Skin:  Negative for rash.       Objective:   Physical Exam Eyes:     General: No scleral icterus. Pulmonary:     Effort: Pulmonary effort is normal.  Skin:    Findings: No rash.  Neurological:     Mental Status: He is alert.           Assessment & Plan:

## 2022-09-16 NOTE — Assessment & Plan Note (Addendum)
He has nearly completed the 12 weeks and will check a hepatic function panel again today.  Has been wnl.   I discussed the treatment course, that the test will remain positive and no indication for retreatment.   Reviewed previous labs.  He will otherwise follow up as needed.  I have personally spent 33 minutes involved in face-to-face and non-face-to-face activities for this patient on the day of the visit. Professional time spent includes the following activities: Preparing to see the patient (review of tests), Obtaining and/or reviewing separately obtained history (admission/discharge record), Performing a medically appropriate examination and/or evaluation , Ordering medications/tests/procedures, referring and communicating with other health care professionals, Documenting clinical information in the EMR, Independently interpreting results (not separately reported), Communicating results to the patient/family/caregiver, Counseling and educating the patient/family/caregiver and Care coordination (not separately reported).

## 2022-09-17 LAB — HEPATIC FUNCTION PANEL
AG Ratio: 1.3 (calc) (ref 1.0–2.5)
ALT: 15 U/L (ref 9–46)
AST: 33 U/L (ref 10–35)
Albumin: 3.9 g/dL (ref 3.6–5.1)
Alkaline phosphatase (APISO): 101 U/L (ref 35–144)
Bilirubin, Direct: 0.3 mg/dL — ABNORMAL HIGH (ref 0.0–0.2)
Globulin: 3 g/dL (calc) (ref 1.9–3.7)
Indirect Bilirubin: 0.3 mg/dL (calc) (ref 0.2–1.2)
Total Bilirubin: 0.6 mg/dL (ref 0.2–1.2)
Total Protein: 6.9 g/dL (ref 6.1–8.1)

## 2022-09-29 DIAGNOSIS — K5010 Crohn's disease of large intestine without complications: Secondary | ICD-10-CM | POA: Diagnosis not present

## 2022-09-29 DIAGNOSIS — K5090 Crohn's disease, unspecified, without complications: Secondary | ICD-10-CM | POA: Diagnosis not present

## 2022-10-19 DIAGNOSIS — L570 Actinic keratosis: Secondary | ICD-10-CM | POA: Diagnosis not present

## 2022-10-19 DIAGNOSIS — L298 Other pruritus: Secondary | ICD-10-CM | POA: Diagnosis not present

## 2022-10-21 DIAGNOSIS — H2513 Age-related nuclear cataract, bilateral: Secondary | ICD-10-CM | POA: Diagnosis not present

## 2022-10-21 DIAGNOSIS — H43813 Vitreous degeneration, bilateral: Secondary | ICD-10-CM | POA: Diagnosis not present

## 2022-10-27 DIAGNOSIS — K5090 Crohn's disease, unspecified, without complications: Secondary | ICD-10-CM | POA: Diagnosis not present

## 2022-10-27 DIAGNOSIS — K5010 Crohn's disease of large intestine without complications: Secondary | ICD-10-CM | POA: Diagnosis not present

## 2022-11-24 DIAGNOSIS — K5010 Crohn's disease of large intestine without complications: Secondary | ICD-10-CM | POA: Diagnosis not present

## 2022-11-24 DIAGNOSIS — K5090 Crohn's disease, unspecified, without complications: Secondary | ICD-10-CM | POA: Diagnosis not present

## 2022-12-22 DIAGNOSIS — K5010 Crohn's disease of large intestine without complications: Secondary | ICD-10-CM | POA: Diagnosis not present

## 2022-12-22 DIAGNOSIS — K5090 Crohn's disease, unspecified, without complications: Secondary | ICD-10-CM | POA: Diagnosis not present

## 2023-01-05 DIAGNOSIS — M8580 Other specified disorders of bone density and structure, unspecified site: Secondary | ICD-10-CM | POA: Diagnosis not present

## 2023-01-05 DIAGNOSIS — Z79899 Other long term (current) drug therapy: Secondary | ICD-10-CM | POA: Diagnosis not present

## 2023-01-05 DIAGNOSIS — K5010 Crohn's disease of large intestine without complications: Secondary | ICD-10-CM | POA: Diagnosis not present

## 2023-01-05 DIAGNOSIS — N189 Chronic kidney disease, unspecified: Secondary | ICD-10-CM | POA: Diagnosis not present

## 2023-01-12 DIAGNOSIS — D509 Iron deficiency anemia, unspecified: Secondary | ICD-10-CM | POA: Diagnosis not present

## 2023-01-12 DIAGNOSIS — Z0000 Encounter for general adult medical examination without abnormal findings: Secondary | ICD-10-CM | POA: Diagnosis not present

## 2023-01-12 DIAGNOSIS — E559 Vitamin D deficiency, unspecified: Secondary | ICD-10-CM | POA: Diagnosis not present

## 2023-01-12 DIAGNOSIS — Z6821 Body mass index (BMI) 21.0-21.9, adult: Secondary | ICD-10-CM | POA: Diagnosis not present

## 2023-01-12 DIAGNOSIS — R490 Dysphonia: Secondary | ICD-10-CM | POA: Diagnosis not present

## 2023-01-12 DIAGNOSIS — N1831 Chronic kidney disease, stage 3a: Secondary | ICD-10-CM | POA: Diagnosis not present

## 2023-01-12 DIAGNOSIS — E538 Deficiency of other specified B group vitamins: Secondary | ICD-10-CM | POA: Diagnosis not present

## 2023-01-19 DIAGNOSIS — K5010 Crohn's disease of large intestine without complications: Secondary | ICD-10-CM | POA: Diagnosis not present

## 2023-01-19 DIAGNOSIS — K5090 Crohn's disease, unspecified, without complications: Secondary | ICD-10-CM | POA: Diagnosis not present

## 2023-02-08 DIAGNOSIS — R490 Dysphonia: Secondary | ICD-10-CM | POA: Diagnosis not present

## 2023-02-16 DIAGNOSIS — K5010 Crohn's disease of large intestine without complications: Secondary | ICD-10-CM | POA: Diagnosis not present

## 2023-03-16 DIAGNOSIS — K5010 Crohn's disease of large intestine without complications: Secondary | ICD-10-CM | POA: Diagnosis not present

## 2023-03-16 DIAGNOSIS — K5090 Crohn's disease, unspecified, without complications: Secondary | ICD-10-CM | POA: Diagnosis not present

## 2023-03-26 DIAGNOSIS — Z2989 Encounter for other specified prophylactic measures: Secondary | ICD-10-CM | POA: Diagnosis not present

## 2023-03-26 DIAGNOSIS — Z23 Encounter for immunization: Secondary | ICD-10-CM | POA: Diagnosis not present

## 2023-04-15 DIAGNOSIS — K5090 Crohn's disease, unspecified, without complications: Secondary | ICD-10-CM | POA: Diagnosis not present

## 2023-04-15 DIAGNOSIS — K5010 Crohn's disease of large intestine without complications: Secondary | ICD-10-CM | POA: Diagnosis not present

## 2023-04-19 DIAGNOSIS — E538 Deficiency of other specified B group vitamins: Secondary | ICD-10-CM | POA: Diagnosis not present

## 2023-04-20 DIAGNOSIS — L814 Other melanin hyperpigmentation: Secondary | ICD-10-CM | POA: Diagnosis not present

## 2023-04-20 DIAGNOSIS — Z85820 Personal history of malignant melanoma of skin: Secondary | ICD-10-CM | POA: Diagnosis not present

## 2023-04-20 DIAGNOSIS — Z08 Encounter for follow-up examination after completed treatment for malignant neoplasm: Secondary | ICD-10-CM | POA: Diagnosis not present

## 2023-04-20 DIAGNOSIS — L570 Actinic keratosis: Secondary | ICD-10-CM | POA: Diagnosis not present

## 2023-04-20 DIAGNOSIS — Z85828 Personal history of other malignant neoplasm of skin: Secondary | ICD-10-CM | POA: Diagnosis not present

## 2023-04-20 DIAGNOSIS — D225 Melanocytic nevi of trunk: Secondary | ICD-10-CM | POA: Diagnosis not present

## 2023-04-20 DIAGNOSIS — L821 Other seborrheic keratosis: Secondary | ICD-10-CM | POA: Diagnosis not present

## 2023-05-03 DIAGNOSIS — K5010 Crohn's disease of large intestine without complications: Secondary | ICD-10-CM | POA: Diagnosis not present

## 2023-05-03 DIAGNOSIS — Z6821 Body mass index (BMI) 21.0-21.9, adult: Secondary | ICD-10-CM | POA: Diagnosis not present

## 2023-05-03 DIAGNOSIS — W57XXXA Bitten or stung by nonvenomous insect and other nonvenomous arthropods, initial encounter: Secondary | ICD-10-CM | POA: Diagnosis not present

## 2023-05-03 DIAGNOSIS — S40861A Insect bite (nonvenomous) of right upper arm, initial encounter: Secondary | ICD-10-CM | POA: Diagnosis not present

## 2023-05-03 DIAGNOSIS — S30861A Insect bite (nonvenomous) of abdominal wall, initial encounter: Secondary | ICD-10-CM | POA: Diagnosis not present

## 2023-05-18 DIAGNOSIS — K5010 Crohn's disease of large intestine without complications: Secondary | ICD-10-CM | POA: Diagnosis not present

## 2023-05-18 DIAGNOSIS — K5090 Crohn's disease, unspecified, without complications: Secondary | ICD-10-CM | POA: Diagnosis not present

## 2023-06-02 DIAGNOSIS — B3781 Candidal esophagitis: Secondary | ICD-10-CM | POA: Diagnosis not present

## 2023-06-02 DIAGNOSIS — K2100 Gastro-esophageal reflux disease with esophagitis, without bleeding: Secondary | ICD-10-CM | POA: Diagnosis not present

## 2023-06-02 DIAGNOSIS — K2289 Other specified disease of esophagus: Secondary | ICD-10-CM | POA: Diagnosis not present

## 2023-06-02 DIAGNOSIS — R1310 Dysphagia, unspecified: Secondary | ICD-10-CM | POA: Diagnosis not present

## 2023-06-08 DIAGNOSIS — B3781 Candidal esophagitis: Secondary | ICD-10-CM | POA: Diagnosis not present

## 2023-06-08 DIAGNOSIS — K2100 Gastro-esophageal reflux disease with esophagitis, without bleeding: Secondary | ICD-10-CM | POA: Diagnosis not present

## 2023-06-08 DIAGNOSIS — K2289 Other specified disease of esophagus: Secondary | ICD-10-CM | POA: Diagnosis not present

## 2023-06-10 ENCOUNTER — Other Ambulatory Visit (HOSPITAL_COMMUNITY): Payer: Self-pay | Admitting: Gastroenterology

## 2023-06-10 DIAGNOSIS — N183 Chronic kidney disease, stage 3 unspecified: Secondary | ICD-10-CM | POA: Diagnosis not present

## 2023-06-10 DIAGNOSIS — K603 Anal fistula, unspecified: Secondary | ICD-10-CM

## 2023-06-10 DIAGNOSIS — K50913 Crohn's disease, unspecified, with fistula: Secondary | ICD-10-CM

## 2023-06-10 DIAGNOSIS — K50119 Crohn's disease of large intestine with unspecified complications: Secondary | ICD-10-CM

## 2023-06-10 DIAGNOSIS — N2581 Secondary hyperparathyroidism of renal origin: Secondary | ICD-10-CM | POA: Diagnosis not present

## 2023-06-10 DIAGNOSIS — R194 Change in bowel habit: Secondary | ICD-10-CM | POA: Diagnosis not present

## 2023-06-14 DIAGNOSIS — N2581 Secondary hyperparathyroidism of renal origin: Secondary | ICD-10-CM | POA: Diagnosis not present

## 2023-06-14 DIAGNOSIS — K5090 Crohn's disease, unspecified, without complications: Secondary | ICD-10-CM | POA: Diagnosis not present

## 2023-06-14 DIAGNOSIS — N183 Chronic kidney disease, stage 3 unspecified: Secondary | ICD-10-CM | POA: Diagnosis not present

## 2023-06-14 DIAGNOSIS — N189 Chronic kidney disease, unspecified: Secondary | ICD-10-CM | POA: Diagnosis not present

## 2023-06-14 DIAGNOSIS — D631 Anemia in chronic kidney disease: Secondary | ICD-10-CM | POA: Diagnosis not present

## 2023-06-15 DIAGNOSIS — K5010 Crohn's disease of large intestine without complications: Secondary | ICD-10-CM | POA: Diagnosis not present

## 2023-06-16 ENCOUNTER — Ambulatory Visit (HOSPITAL_COMMUNITY): Payer: Medicare PPO

## 2023-06-16 ENCOUNTER — Encounter (HOSPITAL_COMMUNITY): Payer: Self-pay

## 2023-06-21 DIAGNOSIS — L570 Actinic keratosis: Secondary | ICD-10-CM | POA: Diagnosis not present

## 2023-07-12 DIAGNOSIS — Z6821 Body mass index (BMI) 21.0-21.9, adult: Secondary | ICD-10-CM | POA: Diagnosis not present

## 2023-07-12 DIAGNOSIS — D509 Iron deficiency anemia, unspecified: Secondary | ICD-10-CM | POA: Diagnosis not present

## 2023-07-12 DIAGNOSIS — K50013 Crohn's disease of small intestine with fistula: Secondary | ICD-10-CM | POA: Diagnosis not present

## 2023-07-12 DIAGNOSIS — I10 Essential (primary) hypertension: Secondary | ICD-10-CM | POA: Diagnosis not present

## 2023-07-12 DIAGNOSIS — Z1331 Encounter for screening for depression: Secondary | ICD-10-CM | POA: Diagnosis not present

## 2023-07-12 DIAGNOSIS — N1831 Chronic kidney disease, stage 3a: Secondary | ICD-10-CM | POA: Diagnosis not present

## 2023-07-12 DIAGNOSIS — Z0000 Encounter for general adult medical examination without abnormal findings: Secondary | ICD-10-CM | POA: Diagnosis not present

## 2023-07-13 DIAGNOSIS — K5010 Crohn's disease of large intestine without complications: Secondary | ICD-10-CM | POA: Diagnosis not present

## 2023-07-20 ENCOUNTER — Ambulatory Visit (HOSPITAL_COMMUNITY)
Admission: RE | Admit: 2023-07-20 | Discharge: 2023-07-20 | Disposition: A | Discharge status: Home / Self Care | Source: Ambulatory Visit | Attending: Gastroenterology | Admitting: Gastroenterology

## 2023-07-20 DIAGNOSIS — K50119 Crohn's disease of large intestine with unspecified complications: Secondary | ICD-10-CM | POA: Diagnosis not present

## 2023-07-20 DIAGNOSIS — K50913 Crohn's disease, unspecified, with fistula: Secondary | ICD-10-CM | POA: Diagnosis not present

## 2023-07-20 DIAGNOSIS — N281 Cyst of kidney, acquired: Secondary | ICD-10-CM | POA: Diagnosis not present

## 2023-07-20 DIAGNOSIS — K603 Anal fistula, unspecified: Secondary | ICD-10-CM | POA: Diagnosis not present

## 2023-07-20 DIAGNOSIS — K5090 Crohn's disease, unspecified, without complications: Secondary | ICD-10-CM | POA: Diagnosis not present

## 2023-07-20 DIAGNOSIS — K449 Diaphragmatic hernia without obstruction or gangrene: Secondary | ICD-10-CM | POA: Diagnosis not present

## 2023-07-20 MED ORDER — IOHEXOL 300 MG/ML  SOLN
100.0000 mL | Freq: Once | INTRAMUSCULAR | Status: AC | PRN
  Administered 2023-07-20: 100 mL via INTRAVENOUS

## 2023-07-20 MED ORDER — IOHEXOL 300 MG/ML  SOLN
30.0000 mL | Freq: Once | INTRAMUSCULAR | Status: AC | PRN
  Administered 2023-07-20: 30 mL via ORAL

## 2023-08-02 DIAGNOSIS — K50913 Crohn's disease, unspecified, with fistula: Secondary | ICD-10-CM | POA: Diagnosis not present

## 2023-08-04 DIAGNOSIS — K5010 Crohn's disease of large intestine without complications: Secondary | ICD-10-CM | POA: Diagnosis not present

## 2023-08-04 DIAGNOSIS — K60329 Anal fistula, complex, unspecified: Secondary | ICD-10-CM | POA: Diagnosis not present

## 2023-08-04 DIAGNOSIS — Z79899 Other long term (current) drug therapy: Secondary | ICD-10-CM | POA: Diagnosis not present

## 2023-08-04 DIAGNOSIS — D84821 Immunodeficiency due to drugs: Secondary | ICD-10-CM | POA: Diagnosis not present

## 2023-08-09 DIAGNOSIS — D492 Neoplasm of unspecified behavior of bone, soft tissue, and skin: Secondary | ICD-10-CM | POA: Diagnosis not present

## 2023-08-09 DIAGNOSIS — C44629 Squamous cell carcinoma of skin of left upper limb, including shoulder: Secondary | ICD-10-CM | POA: Diagnosis not present

## 2023-08-10 DIAGNOSIS — K5010 Crohn's disease of large intestine without complications: Secondary | ICD-10-CM | POA: Diagnosis not present

## 2023-08-27 DIAGNOSIS — C44629 Squamous cell carcinoma of skin of left upper limb, including shoulder: Secondary | ICD-10-CM | POA: Diagnosis not present

## 2023-09-07 DIAGNOSIS — K5010 Crohn's disease of large intestine without complications: Secondary | ICD-10-CM | POA: Diagnosis not present

## 2023-10-05 DIAGNOSIS — K5010 Crohn's disease of large intestine without complications: Secondary | ICD-10-CM | POA: Diagnosis not present

## 2023-10-13 DIAGNOSIS — L821 Other seborrheic keratosis: Secondary | ICD-10-CM | POA: Diagnosis not present

## 2023-10-13 DIAGNOSIS — L570 Actinic keratosis: Secondary | ICD-10-CM | POA: Diagnosis not present

## 2023-10-13 DIAGNOSIS — Z08 Encounter for follow-up examination after completed treatment for malignant neoplasm: Secondary | ICD-10-CM | POA: Diagnosis not present

## 2023-10-13 DIAGNOSIS — L218 Other seborrheic dermatitis: Secondary | ICD-10-CM | POA: Diagnosis not present

## 2023-10-13 DIAGNOSIS — L814 Other melanin hyperpigmentation: Secondary | ICD-10-CM | POA: Diagnosis not present

## 2023-10-13 DIAGNOSIS — Z85820 Personal history of malignant melanoma of skin: Secondary | ICD-10-CM | POA: Diagnosis not present

## 2023-10-13 DIAGNOSIS — D225 Melanocytic nevi of trunk: Secondary | ICD-10-CM | POA: Diagnosis not present

## 2023-10-13 DIAGNOSIS — Z85828 Personal history of other malignant neoplasm of skin: Secondary | ICD-10-CM | POA: Diagnosis not present

## 2023-10-26 DIAGNOSIS — H43813 Vitreous degeneration, bilateral: Secondary | ICD-10-CM | POA: Diagnosis not present

## 2023-10-26 DIAGNOSIS — H524 Presbyopia: Secondary | ICD-10-CM | POA: Diagnosis not present

## 2023-10-26 DIAGNOSIS — H25813 Combined forms of age-related cataract, bilateral: Secondary | ICD-10-CM | POA: Diagnosis not present

## 2023-11-02 DIAGNOSIS — K5010 Crohn's disease of large intestine without complications: Secondary | ICD-10-CM | POA: Diagnosis not present

## 2023-11-30 DIAGNOSIS — K5010 Crohn's disease of large intestine without complications: Secondary | ICD-10-CM | POA: Diagnosis not present

## 2023-12-14 DIAGNOSIS — K5090 Crohn's disease, unspecified, without complications: Secondary | ICD-10-CM | POA: Diagnosis not present

## 2023-12-14 DIAGNOSIS — E559 Vitamin D deficiency, unspecified: Secondary | ICD-10-CM | POA: Diagnosis not present

## 2023-12-14 DIAGNOSIS — N183 Chronic kidney disease, stage 3 unspecified: Secondary | ICD-10-CM | POA: Diagnosis not present

## 2023-12-14 DIAGNOSIS — D631 Anemia in chronic kidney disease: Secondary | ICD-10-CM | POA: Diagnosis not present

## 2023-12-14 DIAGNOSIS — N189 Chronic kidney disease, unspecified: Secondary | ICD-10-CM | POA: Diagnosis not present

## 2023-12-28 DIAGNOSIS — K5010 Crohn's disease of large intestine without complications: Secondary | ICD-10-CM | POA: Diagnosis not present

## 2024-01-04 DIAGNOSIS — L308 Other specified dermatitis: Secondary | ICD-10-CM | POA: Diagnosis not present

## 2024-01-04 DIAGNOSIS — Z85828 Personal history of other malignant neoplasm of skin: Secondary | ICD-10-CM | POA: Diagnosis not present

## 2024-01-04 DIAGNOSIS — Z08 Encounter for follow-up examination after completed treatment for malignant neoplasm: Secondary | ICD-10-CM | POA: Diagnosis not present

## 2024-01-07 DIAGNOSIS — K50119 Crohn's disease of large intestine with unspecified complications: Secondary | ICD-10-CM | POA: Diagnosis not present

## 2024-01-11 DIAGNOSIS — L84 Corns and callosities: Secondary | ICD-10-CM | POA: Diagnosis not present

## 2024-01-11 DIAGNOSIS — E559 Vitamin D deficiency, unspecified: Secondary | ICD-10-CM | POA: Diagnosis not present

## 2024-01-11 DIAGNOSIS — D84821 Immunodeficiency due to drugs: Secondary | ICD-10-CM | POA: Diagnosis not present

## 2024-01-11 DIAGNOSIS — K429 Umbilical hernia without obstruction or gangrene: Secondary | ICD-10-CM | POA: Diagnosis not present

## 2024-01-12 DIAGNOSIS — K50913 Crohn's disease, unspecified, with fistula: Secondary | ICD-10-CM | POA: Diagnosis not present

## 2024-01-25 DIAGNOSIS — K5010 Crohn's disease of large intestine without complications: Secondary | ICD-10-CM | POA: Diagnosis not present

## 2024-01-26 ENCOUNTER — Encounter: Payer: Self-pay | Admitting: Podiatry

## 2024-01-26 ENCOUNTER — Ambulatory Visit: Admitting: Podiatry

## 2024-01-26 VITALS — BP 115/69 | HR 83

## 2024-01-26 DIAGNOSIS — D2371 Other benign neoplasm of skin of right lower limb, including hip: Secondary | ICD-10-CM | POA: Diagnosis not present

## 2024-01-26 DIAGNOSIS — D2372 Other benign neoplasm of skin of left lower limb, including hip: Secondary | ICD-10-CM

## 2024-01-26 NOTE — Progress Notes (Signed)
  Subjective:  Patient ID: Jeffrey Mejia, male    DOB: 1945/10/03,   MRN: [id number]  Chief Complaint  Patient presents with   Foot Pain    Bilateral 5th met lateral callused area L is more painful. pain discomfort x 2 months has gotten worse last 2 weeks.  Not Diabetic  no anti coag     78 y.o. male presents for concern of lesion on the bottom of both feet on the outside. Relates it has been present for about 2 months but worsened in the past two weeks.  Relates pain when walking and pressure. Denies any treatments.  . Denies any other pedal complaints. Denies n/v/f/c.   Past Medical History:  Diagnosis Date   Anemia    Arthritis    Crohn's ileitis (HCC)    Esophageal stricture    GERD (gastroesophageal reflux disease)    History of stomach ulcers    Osteopenia    on no current medication   Rosacea     Objective:  Physical Exam: Vascular: DP/PT pulses 2/4 bilateral. CFT <3 seconds. Normal hair growth on digits. No edema.  Skin. No lacerations or abrasions bilateral feet. Hyperkeratotic cored lesions noted sub fifth metatarsal base bilateral with disruption fo skin lines.  Musculoskeletal: MMT 5/5 bilateral lower extremities in DF, PF, Inversion and Eversion. Deceased ROM in DF of ankle joint.  Neurological: Sensation intact to light touch.   Assessment:   1. Benign neoplasm of skin of right foot   2. Benign neoplasm of skin of foot, left      Plan:  Patient was evaluated and treated and all questions answered. -Discussed benign skin lesions with patient and treatment options.  -Hyperkeratotic tissue was debrided with chisel without incident.  -Applied salycylic acid treatment to area with dressing. Advised to remove bandaging tomorrow.  -Encouraged daily moisturizing -Discussed use of pumice stone -Advised good supportive shoes and inserts -Patient to return to office as needed or sooner if condition worsens.   Jeffrey Mejia, DPM

## 2024-02-16 DIAGNOSIS — N189 Chronic kidney disease, unspecified: Secondary | ICD-10-CM | POA: Diagnosis not present

## 2024-02-16 DIAGNOSIS — M8580 Other specified disorders of bone density and structure, unspecified site: Secondary | ICD-10-CM | POA: Diagnosis not present

## 2024-02-16 DIAGNOSIS — M79645 Pain in left finger(s): Secondary | ICD-10-CM | POA: Diagnosis not present

## 2024-02-16 DIAGNOSIS — Z79899 Other long term (current) drug therapy: Secondary | ICD-10-CM | POA: Diagnosis not present

## 2024-02-16 DIAGNOSIS — K5010 Crohn's disease of large intestine without complications: Secondary | ICD-10-CM | POA: Diagnosis not present

## 2024-02-22 DIAGNOSIS — K5010 Crohn's disease of large intestine without complications: Secondary | ICD-10-CM | POA: Diagnosis not present

## 2024-03-21 DIAGNOSIS — K5010 Crohn's disease of large intestine without complications: Secondary | ICD-10-CM | POA: Diagnosis not present
